# Patient Record
Sex: Male | Born: 1965
Health system: Southern US, Community
[De-identification: ages and names within clinical notes are randomized; demographics above are authoritative.]

## PROBLEM LIST (undated history)

## (undated) DIAGNOSIS — E785 Hyperlipidemia, unspecified: Secondary | ICD-10-CM

## (undated) DIAGNOSIS — E079 Disorder of thyroid, unspecified: Secondary | ICD-10-CM

## (undated) HISTORY — PX: APPENDECTOMY: SHX54

## (undated) HISTORY — DX: Hyperlipidemia, unspecified: E78.5

## (undated) HISTORY — DX: Disorder of thyroid, unspecified: E07.9

---

## 2008-12-20 ENCOUNTER — Ambulatory Visit: Payer: Self-pay | Admitting: Family Medicine

## 2008-12-20 DIAGNOSIS — E039 Hypothyroidism, unspecified: Secondary | ICD-10-CM

## 2008-12-20 DIAGNOSIS — E785 Hyperlipidemia, unspecified: Secondary | ICD-10-CM | POA: Insufficient documentation

## 2009-02-14 ENCOUNTER — Ambulatory Visit: Payer: Self-pay | Admitting: Family Medicine

## 2009-02-15 LAB — CONVERTED CEMR LAB: TSH: 0.181 microintl units/mL — ABNORMAL LOW (ref 0.350–4.500)

## 2009-05-03 ENCOUNTER — Ambulatory Visit: Payer: Self-pay | Admitting: Family Medicine

## 2009-05-03 LAB — CONVERTED CEMR LAB: Inflenza A Ag: NEGATIVE

## 2009-05-04 LAB — CONVERTED CEMR LAB
LDL Cholesterol: 117 mg/dL — ABNORMAL HIGH (ref 0–99)
TSH: 0.505 microintl units/mL (ref 0.350–4.500)
Triglycerides: 160 mg/dL — ABNORMAL HIGH (ref <150)

## 2009-11-21 ENCOUNTER — Ambulatory Visit: Payer: Self-pay | Admitting: Family Medicine

## 2009-11-21 DIAGNOSIS — M25519 Pain in unspecified shoulder: Secondary | ICD-10-CM

## 2009-11-21 DIAGNOSIS — R5383 Other fatigue: Secondary | ICD-10-CM

## 2009-11-21 DIAGNOSIS — R5381 Other malaise: Secondary | ICD-10-CM

## 2009-11-23 LAB — CONVERTED CEMR LAB
ALT: 26 units/L (ref 0–53)
Alkaline Phosphatase: 83 units/L (ref 39–117)
LDL Cholesterol: 201 mg/dL — ABNORMAL HIGH (ref 0–99)
MCHC: 34.3 g/dL (ref 30.0–36.0)
RDW: 13.4 % (ref 11.5–15.5)
Sed Rate: 2 mm/hr (ref 0–16)
Sex Hormone Binding: 30 nmol/L (ref 13–71)
Sodium: 138 meq/L (ref 135–145)
Testosterone Free: 107.6 pg/mL (ref 47.0–244.0)
Total Bilirubin: 1.1 mg/dL (ref 0.3–1.2)
Total Protein: 7 g/dL (ref 6.0–8.3)
Triglycerides: 151 mg/dL — ABNORMAL HIGH (ref <150)
VLDL: 30 mg/dL (ref 0–40)

## 2009-11-29 ENCOUNTER — Telehealth: Payer: Self-pay | Admitting: Family Medicine

## 2010-01-16 ENCOUNTER — Ambulatory Visit: Payer: Self-pay | Admitting: Emergency Medicine

## 2010-01-16 LAB — CONVERTED CEMR LAB: Rapid Strep: NEGATIVE

## 2010-05-14 NOTE — Assessment & Plan Note (Signed)
Summary: SORE THROAT/NH   Vital Signs:  Patient Profile:   45 Years Old Male CC:      sore throat, sinus pressure  Height:     75.1 inches Weight:      225 pounds O2 Sat:      98 % O2 treatment:    Room Air Temp:     99.0 degrees F oral Pulse rate:   73 / minute Resp:     14 per minute BP sitting:   133 / 77  (left arm) Cuff size:   large  Vitals Entered By: Lajean Saver RN (January 16, 2010 9:24 AM)                  Updated Prior Medication List: LEVOTHROID 150 MCG TABS (LEVOTHYROXINE SODIUM) Take 1 tablet by mouth once a day CRESTOR 10 MG TABS (ROSUVASTATIN CALCIUM) Take 1 tablet by mouth once a day  Current Allergies: No known allergies History of Present Illness Chief Complaint: sore throat, sinus pressure  History of Present Illness: Patient complains of onset of cold symptoms for 2 days.  They have been usingno OTC meds.  He says he tends to get sinus infections quickly.  Last one more than a year ago. + sore throat + cough No pleuritic pain No wheezing + nasal congestion + post-nasal drainage + sinus pain/pressure No itchy/red eyes No earache No hemoptysis No SOB No chills/sweats No fever No nausea No vomiting No abdominal pain No diarrhea No skin rashes No fatigue No myalgias No headache   REVIEW OF SYSTEMS Constitutional Symptoms      Denies fever, chills, night sweats, weight loss, weight gain, and fatigue.  Eyes       Denies change in vision, eye pain, eye discharge, glasses, contact lenses, and eye surgery. Ear/Nose/Throat/Mouth       Complains of sinus problems and sore throat.      Denies hearing loss/aids, change in hearing, ear pain, ear discharge, dizziness, frequent runny nose, frequent nose bleeds, hoarseness, and tooth pain or bleeding.  Respiratory       Denies dry cough, productive cough, wheezing, shortness of breath, asthma, bronchitis, and emphysema/COPD.  Cardiovascular       Denies murmurs, chest pain, and tires easily with  exhertion.    Gastrointestinal       Denies stomach pain, nausea/vomiting, diarrhea, constipation, blood in bowel movements, and indigestion. Genitourniary       Denies painful urination, kidney stones, and loss of urinary control. Neurological       Denies paralysis, seizures, and fainting/blackouts. Musculoskeletal       Denies muscle pain, joint pain, joint stiffness, decreased range of motion, redness, swelling, muscle weakness, and gout.  Skin       Denies bruising, unusual mles/lumps or sores, and hair/skin or nail changes.  Psych       Denies mood changes, temper/anger issues, anxiety/stress, speech problems, depression, and sleep problems.  Past History:  Past Medical History: Reviewed history from 12/20/2008 and no changes required. Hyperlipidemia Hypothyroidism  Past Surgical History: Reviewed history from 02/14/2009 and no changes required. Appendectomy 1988  Family History: Reviewed history from 12/20/2008 and no changes required. noncontributory  Social History: BS degree.  Single. Has 2 kids.  Alcohol use-no Drug use-no Regular exercise-no Caffeine 2-3 drinks per day.  Physical Exam General appearance: well developed, well nourished, no acute distress Head: sinus tenderness Ears: normal, no lesions or deformities Nasal: mucosa pink, nonedematous, no septal deviation, turbinates normal  Oral/Pharynx: tongue normal, posterior pharynx without erythema or exudate Chest/Lungs: no rales, wheezes, or rhonchi bilateral, breath sounds equal without effort Heart: regular rate and  rhythm, no murmur MSE: oriented to time, place, and person Assessment New Problems: SINUSITIS, ACUTE (ICD-461.9)  Rapid strep negative  Patient Education: Patient and/or caregiver instructed in the following: rest, fluids, Tylenol prn. nasal saline, Sudafed, Mucinex, OTC cough meds  Plan New Medications/Changes: AMOXICILLIN 875 MG TABS (AMOXICILLIN) 1 tab by mouth two times a day  for 10 days  #20 x 0, 01/16/2010, Hoyt Koch MD  New Orders: Est. Patient Level II [[address]109] Rapid Strep [60454] Planning Comments:   Follow-up with your primary care physician if not improving or if getting worse  It may take 7-10 days to get better   The patient and/or caregiver has been counseled thoroughly with regard to medications prescribed including dosage, schedule, interactions, rationale for use, and possible side effects and they verbalize understanding.  Diagnoses and expected course of recovery discussed and will return if not improved as expected or if the condition worsens. Patient and/or caregiver verbalized understanding.  Prescriptions: AMOXICILLIN 875 MG TABS (AMOXICILLIN) 1 tab by mouth two times a day for 10 days  #20 x 0   Entered and Authorized by:   Hoyt Koch MD   Signed by:   Hoyt Koch MD on 01/16/2010   Method used:   Print then Give to Patient   RxID:   [id number]   Orders Added: 1)  Est. Patient Level II [30865] 2)  Rapid Strep [78469]  Laboratory Results  Date/Time Received: January 16, 2010 9:26 AM  Date/Time Reported: January 16, 2010 9:26 AM   Other Tests  Rapid Strep: negative  Kit Test Internal QC: Negative   (Normal Range: Negative)

## 2010-05-14 NOTE — Assessment & Plan Note (Signed)
Summary: CPE, fatigue   Vital Signs:  Patient profile:   45 year old male Height:      75.1 inches Weight:      229 pounds BMI:     28.65 Pulse rate:   81 / minute BP sitting:   126 / 82  (left arm) Cuff size:   large  Vitals Entered By: Avon Gully CMA, Duncan Dull) (November 21, 2009 8:12 AM) CC: CPE, wt gain and fatigue, concerned thyroid meds may need to be adjusted   Primary Care Provider:  Nani Gasser MD  CC:  CPE, wt gain and fatigue, and concerned thyroid meds may need to be adjusted.  History of Present Illness: CPE, wt gain and fatigue, concerned thyroid meds may need to be adjusted. Has gained 14 lbs. Has been off the Crestor for months.  BP looks great.  Has no motivation as well. Has noticed some shoulder joint pain.  Plays softball. Feels hips are sore.  Not sleeping well.  Feeling more tired in the mornings. No snoring. Increased stress right now.  having bilat shoulder pain. Worse when he sleeps on his shoulders. Says he has not been very active or exercising and feels it may from that.  Uncomfortable to fully extend arms above his head.  Not taking any pain meds for it.  No old injuries.   Current Medications (verified): 1)  Levothroid 150 Mcg Tabs (Levothyroxine Sodium) .... Take 1 Tablet By Mouth Once A Day  Allergies (verified): No Known Drug Allergies  Comments:  Nurse/Medical Assistant: The patient's medications and allergies were reviewed with the patient and were updated in the Medication and Allergy Lists. Avon Gully CMA, Duncan Dull) (November 21, 2009 8:14 AM)  Physical Exam  General:  Well-developed,well-nourished,in no acute distress; alert,appropriate and cooperative throughout examination Head:  Normocephalic and atraumatic without obvious abnormalities. No apparent alopecia or balding. Eyes:  No corneal or conjunctival inflammation noted. EOMI. Perrla.  Ears:  External ear exam shows no significant lesions or deformities.  Otoscopic  examination reveals clear canals, tympanic membranes are intact bilaterally without bulging, retraction, inflammation or discharge. Hearing is grossly normal bilaterally. Nose:  External nasal examination shows no deformity or inflammation. Nasal mucosa are pink and moist without lesions or exudates. Mouth:  Oral mucosa and oropharynx without lesions or exudates.  Teeth in good repair. Neck:  No deformities, masses, or tenderness noted. Chest Wall:  No deformities, masses, tenderness or gynecomastia noted. Lungs:  Normal respiratory effort, chest expands symmetrically. Lungs are clear to auscultation, no crackles or wheezes. Heart:  Normal rate and regular rhythm. S1 and S2 normal without gallop, murmur, click, rub or other extra sounds. Abdomen:  Bowel sounds positive,abdomen soft and non-tender without masses, organomegaly or hernias noted. Msk:  No deformity or scoliosis noted of thoracic or lumbar spine.  Shoulder wtih extension to about 160 degrees.  Nontender on exam. Shoulder strength 5/5.  Pulses:  R and L carotid,radial,dorsalis pedis and posterior tibial pulses are full and equal bilaterally Extremities:  No clubbing, cyanosis, edema, or deformity noted with normal full range of motion of all joints.   Neurologic:  No cranial nerve deficits noted. Station and gait are normal.  DTRs are symmetrical throughout. Sensory, motor and coordinative functions appear intact. Skin:  TAg moleon his right neck. a few erythematous papules on his shoulder. He says they ahve been present since he had a bad sunburn 2 months ago.  Cervical Nodes:  No lymphadenopathy noted Psych:  Cognition and judgment appear intact.  Alert and cooperative with normal attention span and concentration. No apparent delusions, illusions, hallucinations   Impression & Recommendations:  Problem # 1:  HEALTH MAINTENANCE EXAM (ICD-V70.0) If lesion on shoulders don't resolve in the next month or two recommend bx.  Let himknow we  can remove the mole on his neck for cosmetic reasons.  Due for screening labs. Will check TSH since has been complaining of fatigue and joint aches.  Orders: T-Comprehensive Metabolic Panel [id number]) T-Lipid Profile ([id number]) T-TSH [id number])  Problem # 2:  FATIGUE (ICD-780.79) sCreen for anmia, thyroid d/o etc. Will check sed rate since shoudlers and hips have been painful as well.  Also discussed that he likely has depression. Dsicussed potential tx but he really feels like his lack of sleep is causing his mood problem and is not interested in tx. Expalined how these 2 can be related. His did screen Yes to every question on the low "t" questionnair so will check his hormone levels.  His PHQ-9 score was 12 (moderate). Orders: T-CBC No Diff ([id number]) T-Testosterone, Free and Total [id number]) T-Sed Rate (Automated) [id number])  Problem # 3:  SHOULDER PAIN (ICD-719.41) Likely bursits based on exam but pt wasn't really interested in any intervention or tx a this time.   Complete Medication List: 1)  Levothroid 150 Mcg Tabs (Levothyroxine sodium) .... Take 1 tablet by mouth once a day  Patient Instructions: 1)  We will call you with your lab results on Friday.   TD Result Date:  04/14/2006 TD Result:  given TD Next Due:  10 yr

## 2010-05-14 NOTE — Progress Notes (Signed)
Summary: meds  Phone Note Call from Patient   Caller: Patient Call For: Nani Gasser MD Summary of Call: pt called back and wants to know if he can get 10 mg of crestor and cut them in half so he can get more of the medicine and not have to switch to Lipitor Initial call taken by: Avon Gully CMA, Duncan Dull),  November 29, 2009 1:42 PM  Follow-up for Phone Call        Absolutely. Will call in teh 10mg  dose.  Follow-up by: Nani Gasser MD,  November 29, 2009 2:34 PM  Additional Follow-up for Phone Call Additional follow up Details #1::        pt notified Additional Follow-up by: Avon Gully CMA, Duncan Dull),  November 29, 2009 2:46 PM    New/Updated Medications: CRESTOR 10 MG TABS (ROSUVASTATIN CALCIUM) Take 1 tablet by mouth once a day Prescriptions: CRESTOR 10 MG TABS (ROSUVASTATIN CALCIUM) Take 1 tablet by mouth once a day  #30 x 2   Entered and Authorized by:   Nani Gasser MD   Signed by:   Nani Gasser MD on 11/29/2009   Method used:   Electronically to        [facility] # [phone]* (retail)       [address] [place], [place]  [address]       Ph: [phone]       Fax: [phone]   RxID:   [id number]

## 2010-05-14 NOTE — Assessment & Plan Note (Signed)
Summary: URI, labs   Vital Signs:  Patient profile:   45 year old male Height:      75.1 inches Weight:      215 pounds O2 Sat:      98 % on Room air Temp:     98.8 degrees F oral Pulse rate:   87 / minute BP sitting:   107 / 68  (left arm) Cuff size:   large  Vitals Entered By: Kathlene November (May 03, 2009 10:52 AM)  O2 Flow:  Room air CC: for 2 days running fever at night- did not take temp- sweats, head congestion, H/A, nasal drainage, joints ache. Did take #3 Cipro in the last 2 days   Primary Care Provider:  Nani Gasser MD  CC:  for 2 days running fever at night- did not take temp- sweats, head congestion, H/A, nasal drainage, and joints ache. Did take #3 Cipro in the last 2 days.  History of Present Illness: for 2 days running fever at night- did not take temp- sweats, head congestion, H/A, nasal drainage, joints ache. Did take #3 Cipro in the last 2 days.  It is an old med had left over.  Feels alittle SOB.  No hx of asthma.  Still smokes. Not on any  OTC meds. does feel a little better today.   Current Medications (verified): 1)  Crestor 5 Mg  Tabs (Rosuvastatin Calcium) .... Take 1 Tab By Mouth Daily 2)  Levothroid 150 Mcg Tabs (Levothyroxine Sodium) .... Take 1 Tablet By Mouth Once A Day  Allergies (verified): No Known Drug Allergies  Comments:  Nurse/Medical Assistant: The patient's medications and allergies were reviewed with the patient and were updated in the Medication and Allergy Lists. Kathlene November (May 03, 2009 10:54 AM)  Social History: Reviewed history from 02/14/2009 and no changes required. BS degree.  Single. Has 2 kids.  Current Smoker Alcohol use-yes Alcohol use-no Drug use-no Regular exercise-no Caffeine 2-3 drinks per day.   Physical Exam  General:  Well-developed,well-nourished,in no acute distress; alert,appropriate and cooperative throughout examination Head:  Normocephalic and atraumatic without obvious abnormalities. No  apparent alopecia or balding. Eyes:  No corneal or conjunctival inflammation noted. EOMI. Perrla.  Ears:  External ear exam shows no significant lesions or deformities.  Otoscopic examination reveals clear canals, tympanic membranes are intact bilaterally without bulging, retraction, inflammation or discharge. Hearing is grossly normal bilaterally. Nose:  External nasal examination shows no deformity or inflammation. Nasal mucosa are pink and moist without lesions or exudates. Mouth:  Oral mucosa and oropharynx without lesions or exudates.  Teeth in good repair. Neck:  No deformities, masses, or tenderness noted. Lungs:  Normal respiratory effort, chest expands symmetrically. Lungs are clear to auscultation, no crackles or wheezes. Heart:  Normal rate and regular rhythm. S1 and S2 normal without gallop, murmur, click, rub or other extra sounds. Pulses:  Radial 2+  Skin:  no rashes.   Cervical Nodes:  No lymphadenopathy noted Psych:  Cognition and judgment appear intact. Alert and cooperative with normal attention span and concentration. No apparent delusions, illusions, hallucinations   Impression & Recommendations:  Problem # 1:  URI (ICD-465.9) Flu like illness thought flu test was neg.  Dsicussed symptomatic care. Call if not better into next week or if has fever for more than 4-5 days.    Orders: Flu A+B (69678)  Problem # 2:  HYPOTHYROIDISM (ICD-244.9) Overdue to check level.  His updated medication list for this problem includes:  Levothroid 150 Mcg Tabs (Levothyroxine sodium) .Marland Kitchen... Take 1 tablet by mouth once a day  Orders: T-TSH ([id number])  Problem # 3:  HYPERLIPIDEMIA (ICD-272.4) Overdue to check level.  His updated medication list for this problem includes:    Crestor 5 Mg Tabs (Rosuvastatin calcium) .Marland Kitchen... Take 1 tab by mouth daily  Orders: T-Lipid Profile ([id number])  Complete Medication List: 1)  Crestor 5 Mg Tabs (Rosuvastatin calcium) .... Take 1 tab by  mouth daily 2)  Levothroid 150 Mcg Tabs (Levothyroxine sodium) .... Take 1 tablet by mouth once a day  Laboratory Results  Date/Time Received: 05/03/2009 Date/Time Reported: 05/03/2009  Other Tests  Influenza A: negative Influenza B: negative

## 2010-05-14 NOTE — Progress Notes (Signed)
Summary: meds  Phone Note Call from Patient   Caller: Patient Call For: Nani Gasser MD Summary of Call: Insurance does not cover the crestor.what eles can be rx'ed Initial call taken by: Avon Gully CMA, Duncan Dull),  November 29, 2009 10:36 AM  Follow-up for Phone Call        Can try lipitor instead. Rx sent.  Follow-up by: Nani Gasser MD,  November 29, 2009 11:32 AM  Additional Follow-up for Phone Call Additional follow up Details #1::        pt notified Additional Follow-up by: Avon Gully CMA, Duncan Dull),  November 29, 2009 11:37 AM    New/Updated Medications: LIPITOR 40 MG TABS (ATORVASTATIN CALCIUM) Take 1 tablet by mouth once a day at bedtime Prescriptions: LIPITOR 40 MG TABS (ATORVASTATIN CALCIUM) Take 1 tablet by mouth once a day at bedtime  #30 x 2   Entered and Authorized by:   Nani Gasser MD   Signed by:   Nani Gasser MD on 11/29/2009   Method used:   Electronically to        [facility] # [phone]* (retail)       [address] [place], [place]  [address]       Ph: [phone]       Fax: [phone]   RxID:   [id number]

## 2010-06-11 ENCOUNTER — Telehealth: Payer: Self-pay | Admitting: Family Medicine

## 2010-06-13 ENCOUNTER — Encounter: Payer: Self-pay | Admitting: Family Medicine

## 2010-06-13 ENCOUNTER — Ambulatory Visit (INDEPENDENT_AMBULATORY_CARE_PROVIDER_SITE_OTHER): Payer: BC Managed Care – PPO | Admitting: Family Medicine

## 2010-06-13 DIAGNOSIS — D239 Other benign neoplasm of skin, unspecified: Secondary | ICD-10-CM

## 2010-06-13 DIAGNOSIS — Z7251 High risk heterosexual behavior: Secondary | ICD-10-CM

## 2010-06-14 ENCOUNTER — Encounter: Payer: Self-pay | Admitting: Family Medicine

## 2010-06-14 LAB — CONVERTED CEMR LAB
Chlamydia, Swab/Urine, PCR: NEGATIVE
GC Probe Amp, Urine: NEGATIVE
HIV: NONREACTIVE

## 2010-06-20 NOTE — Assessment & Plan Note (Signed)
Summary: Mole removal, STD check   Vital Signs:  Patient profile:   45 year old male Height:      75.1 inches Weight:      216 pounds Pulse rate:   85 / minute BP sitting:   123 / 76  (right arm) Cuff size:   large  Vitals Entered By: Avon Gully CMA, Duncan Dull) (June 13, 2010 10:24 AM) CC: skin tag removed on neck,std testing   Primary Care Provider:  Nani Gasser MD  CC:  skin tag removed on neck and std testing.  History of Present Illness: Here for skin tag removal  Has new girlfriend and wouldlike to be checked for STDs. Says has had not d/c or pain but wouldl ike to be checked.   Current Medications (verified): 1)  Levothroid 150 Mcg Tabs (Levothyroxine Sodium) .... Take 1 Tablet By Mouth Once A Day 2)  Crestor 10 Mg Tabs (Rosuvastatin Calcium) .... Take 1 Tablet By Mouth Once A Day  Allergies (verified): No Known Drug Allergies  Comments:  Nurse/Medical Assistant: The patient's medications and allergies were reviewed with the patient and were updated in the Medication and Allergy Lists. Avon Gully CMA, Duncan Dull) (June 13, 2010 10:25 AM)   Impression & Recommendations:  Problem # 1:  BENIGN NEOPLASM OF OTHER SPECIFIED SITES OF SKIN (ICD-216.8)  Mole was removed. We didn't send for pathology. Call if any concernes with wound healing.   Orders: Shave Skin Lesion < 0.5cm face/ears/eyelids/nose/lips/mm (11310)  Problem # 2:  SEXUAL ACTIVITY, HIGH RISK (ICD-V69.2) Will check for STD. eXplained that antibodies to herpes doesn't mean he has active dz.   Complete Medication List: 1)  Levothroid 150 Mcg Tabs (Levothyroxine sodium) .... Take 1 tablet by mouth once a day 2)  Crestor 10 Mg Tabs (Rosuvastatin calcium) .... Take 1 tablet by mouth once a day  Other Orders: T-HIV Antibody  (Reflex) [id number]) T-Syphilis Test (RPR) [id number]) T-Chlamydia & GC Probe, Urine ([id number]) T-Herpes Simplex Type 1 (96295-28413) T-Herpes Simplex Type  2 (24401-02725)   Orders Added: 1)  T-HIV Antibody  (Reflex) [[id number]] 2)  T-Syphilis Test (RPR) [25956-38756] 3)  T-Chlamydia & GC Probe, Urine [[id number]] 4)  T-Herpes Simplex Type 1 [43329-51884] 5)  T-Herpes Simplex Type 2 [86696-81071] 6)  Shave Skin Lesion < 0.5cm face/ears/eyelids/nose/lips/mm [11310] 7)  Est. Patient Level II [16606]    Procedure Note Last Tetanus: given (04/14/2006)  Mole Biopsy/Removal: Indication: inflamed lesion  Procedure # 1: shave biopsy    Size (in cm): 0.5 x 0.5    Region: Left side of neckl     Instrument used: double edge blade.     Anesthesia: 1% lidocaine w/epinephrine  Cleaned and prepped with: alcohol and betadine Wound dressing: bulky gauze dressing Additional Instructions: Heostasis acheived wtih pressure.

## 2010-06-20 NOTE — Progress Notes (Signed)
Summary: Testing  Phone Note Call from Patient Call back at [phone]   Caller: Patient Summary of Call: Pt has an appt on 06/13/10 to remove a mole on his neck, he has also requested STD testing, pls contact pt Initial call taken by: Lannette Donath,  June 11, 2010 4:30 PM  Follow-up for Phone Call        ? appt made;called pt and he didnt have any questions Follow-up by: Avon Gully CMA, Duncan Dull),  June 12, 2010 1:49 PM

## 2010-07-17 ENCOUNTER — Other Ambulatory Visit: Payer: Self-pay | Admitting: Family Medicine

## 2010-08-13 ENCOUNTER — Ambulatory Visit: Payer: BC Managed Care – PPO | Admitting: Family Medicine

## 2010-08-15 ENCOUNTER — Ambulatory Visit: Payer: BC Managed Care – PPO | Admitting: Family Medicine

## 2010-09-27 ENCOUNTER — Other Ambulatory Visit: Payer: Self-pay | Admitting: Family Medicine

## 2010-10-02 ENCOUNTER — Encounter: Payer: Self-pay | Admitting: Family Medicine

## 2010-10-03 ENCOUNTER — Encounter: Payer: Self-pay | Admitting: Family Medicine

## 2010-10-03 ENCOUNTER — Ambulatory Visit (INDEPENDENT_AMBULATORY_CARE_PROVIDER_SITE_OTHER): Payer: BC Managed Care – PPO | Admitting: Family Medicine

## 2010-10-03 DIAGNOSIS — J329 Chronic sinusitis, unspecified: Secondary | ICD-10-CM

## 2010-10-03 DIAGNOSIS — M25521 Pain in right elbow: Secondary | ICD-10-CM

## 2010-10-03 DIAGNOSIS — E039 Hypothyroidism, unspecified: Secondary | ICD-10-CM

## 2010-10-03 DIAGNOSIS — E785 Hyperlipidemia, unspecified: Secondary | ICD-10-CM

## 2010-10-03 DIAGNOSIS — B009 Herpesviral infection, unspecified: Secondary | ICD-10-CM

## 2010-10-03 DIAGNOSIS — M25529 Pain in unspecified elbow: Secondary | ICD-10-CM

## 2010-10-03 MED ORDER — AMOXICILLIN 875 MG PO TABS: 875.0000 mg | ORAL_TABLET | Freq: Two times a day (BID) | ORAL | Status: AC

## 2010-10-03 NOTE — Assessment & Plan Note (Signed)
He is to recheck his lipids as his LDL was not quite at goal in January. I also recommended regular exercise and weight loss. He's gained over 20 pounds in the last 2 years.

## 2010-10-03 NOTE — Progress Notes (Signed)
  Subjective:    Patient ID: Michael Wong, male    DOB: 1965-08-31, 45 y.o.   MRN: [id number]  HPI  He is here today with his girlfriend to discuss his lab tests his sugar he is positive for herpes simplex 1 herpes simplex 2.  He also has had nasal congestion for last couple weeks it got worse the last 3 days. No fever no cough or short of breath. Mild sore throat the last couple days. No ear pain. He has not been taking any over-the-counter decongestants. He says he feels that his nose is full of solid. If that'll mild facial pain and pressure. No recent allergy symptoms.  He also thinks he is due for his cholesterol and thyroid levels.  He also has a right elbow injury. He says he tore a tendon and would like to see an orthopedist about it. He says it's just not healing on its own.  Review of Systems     Objective:   Physical Exam  Constitutional: He is oriented to person, place, and time. He appears well-developed and well-nourished.  HENT:  Head: Normocephalic and atraumatic.  Right Ear: External ear normal.  Left Ear: External ear normal.  Nose: Nose normal.  Mouth/Throat: Oropharynx is clear and moist.       TMs and canals are clear. He has a large hole in his nasal septum.  Eyes: Conjunctivae and EOM are normal. Pupils are equal, round, and reactive to light.  Neck: Neck supple. No thyromegaly present.  Cardiovascular: Normal rate and normal heart sounds.   Pulmonary/Chest: Effort normal and breath sounds normal.  Lymphadenopathy:    He has no cervical adenopathy.  Neurological: He is alert and oriented to person, place, and time.  Skin: Skin is warm and dry.  Psychiatric: He has a normal mood and affect.          Assessment & Plan:  Sinusitis-we will go ahead and treat with antibiotics. If not better in 10 days please let me know. He can use an over-the-counter symptomatic treatment such as cold medications if he would like.  Positive test for herpes simplex 1  and 2-we discussed that does result me that he is status post and has antibodies. If he does not have any active lesions and he may not actually shed the virus. Though there still are small risk of still shedding the virus even without lesions. I did recommend using condoms. And certainly if he notices a mouth or penile lesion thinking come in and we can swallow but to confirm if he does have active herpes simplex. We also discussed the risk of transformation to an infant during the delivery process.

## 2010-10-03 NOTE — Assessment & Plan Note (Signed)
He is due to recheck his levels.

## 2010-10-04 ENCOUNTER — Telehealth: Payer: Self-pay | Admitting: Family Medicine

## 2010-10-04 LAB — TSH: TSH: 3.156 u[IU]/mL (ref 0.350–4.500)

## 2010-10-04 LAB — COMPLETE METABOLIC PANEL WITH GFR
ALT: 42 U/L (ref 0–53)
CO2: 27 mEq/L (ref 19–32)
Calcium: 10.2 mg/dL (ref 8.4–10.5)
Chloride: 100 mEq/L (ref 96–112)
Creat: 1.1 mg/dL (ref 0.50–1.35)
GFR, Est African American: >60 mL/min (ref >60)

## 2010-10-04 LAB — LIPID PANEL
HDL: 44 mg/dL (ref >39)
LDL Cholesterol: 119 mg/dL — ABNORMAL HIGH (ref 0–99)
Total CHOL/HDL Ratio: 4.2 Ratio

## 2010-10-04 MED ORDER — ROSUVASTATIN CALCIUM 10 MG PO TABS: 20.0000 mg | ORAL_TABLET | Freq: Every day | ORAL | Status: DC

## 2010-10-04 NOTE — Telephone Encounter (Signed)
Consultation: Complete metabolic panel and thyroid look great. Cholesterol looks much much better. LDL is down to 119. We are MS there. I would like to increase his Crestor to 20 mg if he is okay with this. Then recheck cholesterol in 6 months.

## 2010-10-04 NOTE — Telephone Encounter (Signed)
Pt notified by [facility] to increase the crestor to 20 mg.  Repeat Fast chol panel in 6 mths.  Gave details of lab results. Jarvis Newcomer, LPN [facility]

## 2010-10-10 ENCOUNTER — Other Ambulatory Visit: Payer: Self-pay | Admitting: Sports Medicine

## 2010-10-10 ENCOUNTER — Ambulatory Visit
Admission: RE | Admit: 2010-10-10 | Discharge: 2010-10-10 | Disposition: A | Discharge status: Home / Self Care | Payer: BC Managed Care – PPO | Source: Ambulatory Visit | Attending: Sports Medicine | Admitting: Sports Medicine

## 2010-10-10 DIAGNOSIS — M25521 Pain in right elbow: Secondary | ICD-10-CM

## 2010-10-25 ENCOUNTER — Telehealth: Payer: Self-pay | Admitting: Family Medicine

## 2010-10-25 NOTE — Telephone Encounter (Signed)
Pt called and said he thinks the pharm has messed up his Crestor script. Plan:  Reviewed the pt chart and explained to the pt he was on 10mg  prior to provider increasing the dose in June of this year and he was to take 2 of his 10 mg to make the 20 mg dose every night.  Also, explained to the pt that his new dose is 20 mg daily and a new script was sent to his pharm for that dose for #30/6 refills and even called the pharm to verify they had that script at the pharm and they did. Jarvis Newcomer, LPN [facility]

## 2011-05-22 ENCOUNTER — Other Ambulatory Visit: Payer: Self-pay | Admitting: Family Medicine

## 2011-07-24 ENCOUNTER — Telehealth: Payer: Self-pay | Admitting: *Deleted

## 2011-07-24 MED ORDER — ROSUVASTATIN CALCIUM 10 MG PO TABS: 10.0000 mg | ORAL_TABLET | Freq: Every day | ORAL | Status: DC

## 2011-07-24 NOTE — Telephone Encounter (Signed)
Pt states that he thought he was only suppose to take 5mg  Crestor but he recently went to have it refilled since he has insurance now and that his bottled stated take 2 10mg  tabs nightly. Pt would like to know if this is correct and if you want him to have some labs done since the last ones were June 2012. He also states that if he needs to make an appt he will. Please advise.

## 2011-07-24 NOTE — Telephone Encounter (Signed)
I had in the systme he was taking 10 mg so when I got the cholesterol level back in August it was still not completely at goal so I increased it to 2 tabs. If he was only taking 5 mg and I would like him to increase to 10 once a day. Then he has he needs to followup in about one month on the 10 mg tablet.

## 2011-07-24 NOTE — Telephone Encounter (Signed)
Pt informed

## 2011-08-08 ENCOUNTER — Other Ambulatory Visit: Payer: Self-pay | Admitting: Family Medicine

## 2011-09-01 ENCOUNTER — Other Ambulatory Visit: Payer: Self-pay | Admitting: Family Medicine

## 2011-10-05 ENCOUNTER — Other Ambulatory Visit: Payer: Self-pay | Admitting: Family Medicine

## 2011-10-20 ENCOUNTER — Encounter: Payer: Self-pay | Admitting: Physician Assistant

## 2011-10-20 ENCOUNTER — Ambulatory Visit (INDEPENDENT_AMBULATORY_CARE_PROVIDER_SITE_OTHER): Payer: BC Managed Care – PPO | Admitting: Physician Assistant

## 2011-10-20 VITALS — BP 120/83 | HR 85 | Temp 98.8°F | Ht 72.0 in | Wt 227.0 lb

## 2011-10-20 DIAGNOSIS — J029 Acute pharyngitis, unspecified: Secondary | ICD-10-CM

## 2011-10-20 DIAGNOSIS — Z131 Encounter for screening for diabetes mellitus: Secondary | ICD-10-CM

## 2011-10-20 DIAGNOSIS — E039 Hypothyroidism, unspecified: Secondary | ICD-10-CM

## 2011-10-20 DIAGNOSIS — J329 Chronic sinusitis, unspecified: Secondary | ICD-10-CM

## 2011-10-20 DIAGNOSIS — E785 Hyperlipidemia, unspecified: Secondary | ICD-10-CM

## 2011-10-20 LAB — LIPID PANEL
HDL: 43 mg/dL (ref >39)
LDL Cholesterol: 109 mg/dL — ABNORMAL HIGH (ref 0–99)
Total CHOL/HDL Ratio: 4.2 Ratio
VLDL: 30 mg/dL (ref 0–40)

## 2011-10-20 LAB — COMPREHENSIVE METABOLIC PANEL
ALT: 43 U/L (ref 0–53)
Alkaline Phosphatase: 70 U/L (ref 39–117)
Sodium: 140 mEq/L (ref 135–145)
Total Bilirubin: 1.2 mg/dL (ref 0.3–1.2)
Total Protein: 7.2 g/dL (ref 6.0–8.3)

## 2011-10-20 MED ORDER — AMOXICILLIN-POT CLAVULANATE 875-125 MG PO TABS: 1.0000 | ORAL_TABLET | Freq: Two times a day (BID) | ORAL | Status: AC

## 2011-10-20 NOTE — Progress Notes (Signed)
  Subjective:    Patient ID: Michael Wong, male    DOB: [birth year], 46 y.o.   MRN: [id number]  HPI Patient presents to the clinic because he's had sinus pressure and headaches off and on for the last month. If sinus pressure and headaches have progressively been getting worse. He feels like he had a fever a couple of times in the last month but has not checked it. He reports ear pressure, sore throat, dry cough. He denies any chills, shortness of breath, chest pains, palpitations. He has gargled with salt water multiple times and used ibuprofen. This treatment helps minimally.   Patient has hypothyroidism and needs blood work and refills on meds today.  Patient also has hyperlipidemia and is due for a cholesterol check and refill on medications. He has not been exercising or watching his diet.  Review of Systems     Objective:   Physical Exam  Constitutional: He is oriented to person, place, and time. He appears well-developed and well-nourished.  HENT:  Head: Normocephalic and atraumatic.  Right Ear: External ear normal.  Left Ear: External ear normal.  Nose: Nose normal.       TMs are normal bilaterally. Positive for maxillary tenderness to palpation over the frontal sinuses but negative to tenderness over the maxillary sinuses. Oropharynx was red but not swollen and negative for exudate.  Eyes: Conjunctivae are normal.  Neck: Normal range of motion. Neck supple. No thyromegaly present.       Bilateral anterior cervical superficial enlargement without tenderness to palpation.  Cardiovascular: Normal rate, regular rhythm and normal heart sounds.   Pulmonary/Chest: Effort normal and breath sounds normal. He has no wheezes.  Lymphadenopathy:    He has cervical adenopathy.  Neurological: He is alert and oriented to person, place, and time.  Skin: Skin is warm and dry.  Psychiatric: He has a normal mood and affect. His behavior is normal.          Assessment & Plan:    Sinusitis-patient was given Augmentin to take twice a day for 10 days. Patient was encouraged to use Mucinex twice a day to help him clog mucus in sinuses. Handout was given on sinusitis. If patient continues to worsen please give office a call.  Sore throat- Rapid Strep was negative. Continue with symptomatic care such as gargling with salt water, throat lozengers, and honey. Sore throat could be viral or do to postnasal drip draining from the sinuses.  Hypothyroidism-labs were drawn today and will call patient with results and any dose changes.  Hyperlipidemia-labs were drawn today and patient will be called with any changes to medication. As always he was encouraged to exercise more regularly and avoid fried foods, high that she.

## 2011-10-20 NOTE — Patient Instructions (Addendum)
Augmentin given to take for 10 days. Consider Mucinex twice a days to get rid of mucus.   Sinusitis Sinuses are air pockets within the bones of your face. The growth of bacteria within a sinus leads to infection. The infection prevents the sinuses from draining. This infection is called sinusitis. SYMPTOMS  There will be different areas of pain depending on which sinuses have become infected.  The maxillary sinuses often produce pain beneath the eyes.   Frontal sinusitis may cause pain in the middle of the forehead and above the eyes.  Other problems (symptoms) include:  Toothaches.   Colored, pus-like (purulent) drainage from the nose.   Swelling, warmth, and tenderness over the sinus areas may be signs of infection.  TREATMENT  Sinusitis is most often determined by an exam.X-rays may be taken. If x-rays have been taken, make sure you obtain your results or find out how you are to obtain them. Your caregiver may give you medications (antibiotics). These are medications that will help kill the bacteria causing the infection. You may also be given a medication (decongestant) that helps to reduce sinus swelling.  HOME CARE INSTRUCTIONS   Only take over-the-counter or prescription medicines for pain, discomfort, or fever as directed by your caregiver.   Drink extra fluids. Fluids help thin the mucus so your sinuses can drain more easily.   Applying either moist heat or ice packs to the sinus areas may help relieve discomfort.   Use saline nasal sprays to help moisten your sinuses. The sprays can be found at your local drugstore.  SEEK IMMEDIATE MEDICAL CARE IF:  You have a fever.   You have increasing pain, severe headaches, or toothache.   You have nausea, vomiting, or drowsiness.   You develop unusual swelling around the face or trouble seeing.  MAKE SURE YOU:   Understand these instructions.   Will watch your condition.   Will get help right away if you are not doing well  or get worse.  Document Released: 03/31/2005 Document Revised: 03/20/2011 Document Reviewed: 10/28/2006 [facility] Patient Information 2012 [place], [place].

## 2011-10-21 MED ORDER — ROSUVASTATIN CALCIUM 10 MG PO TABS: 10.0000 mg | ORAL_TABLET | Freq: Every day | ORAL | Status: DC

## 2011-10-21 MED ORDER — LEVOTHYROXINE SODIUM 150 MCG PO TABS: 150.0000 ug | ORAL_TABLET | Freq: Every day | ORAL | Status: DC

## 2011-11-15 ENCOUNTER — Other Ambulatory Visit: Payer: Self-pay | Admitting: Family Medicine

## 2012-02-04 ENCOUNTER — Encounter: Payer: Self-pay | Admitting: *Deleted

## 2012-02-04 ENCOUNTER — Emergency Department
Admission: EM | Admit: 2012-02-04 | Discharge: 2012-02-04 | Disposition: A | Discharge status: Home / Self Care | Payer: Self-pay | Source: Home / Self Care | Attending: Family Medicine | Admitting: Family Medicine

## 2012-02-04 DIAGNOSIS — J0100 Acute maxillary sinusitis, unspecified: Secondary | ICD-10-CM

## 2012-02-04 MED ORDER — AZITHROMYCIN 250 MG PO TABS: ORAL_TABLET | ORAL | Status: DC

## 2012-02-04 NOTE — ED Notes (Addendum)
Patient c/o sore throat and nasal draining x 5-6 weeks. Patient c/o right upper jaw feeling sore but states he grinds teeth at night. Drainage white, painful to swallow. Has tried OTC meds with no relief. States "Amoxicillin did not work last time but Z-pack did."

## 2012-02-04 NOTE — ED Provider Notes (Signed)
History     CSN: [id number]  Arrival date & time 02/04/12  4403   First MD Initiated Contact with Patient 02/04/12 1852      Chief Complaint  Patient presents with  . Sore Throat      HPI Comments: Patient c/o sore throat and nasal drainage for 5-6 weeks. Patient c/o right upper jaw feeling sore but states he grinds teeth at night. Drainage white, painful to swallow. Has tried OTC meds with no relief. States "Amoxicillin did not work last time but Z-pack did."  No cough presently.  No fevers, chills, and sweats.  The history is provided by the patient.    Past Medical History  Diagnosis Date  . Hyperlipidemia   . Thyroid disease     Past Surgical History  Procedure Date  . Appendectomy     History reviewed. No pertinent family history.  History  Substance Use Topics  . Smoking status: Never Smoker   . Smokeless tobacco: Not on file  . Alcohol Use: No      Review of Systems + sore throat No cough No pleuritic pain No wheezing + nasal congestion + post-nasal drainage + sinus pain/pressure No itchy/red eyes No earache No hemoptysis No SOB No fever/chills No nausea No vomiting No abdominal pain No diarrhea No urinary symptoms No skin rashes No fatigue No myalgias + headache Used OTC meds without relief  Allergies  Review of patient's allergies indicates no known allergies.  Home Medications   Current Outpatient Rx  Name Route Sig Dispense Refill  . AZITHROMYCIN 250 MG PO TABS  Take 2 tabs today; then begin one tab once daily for 4 more days. 6 each 0  . CRESTOR 10 MG PO TABS  TAKE 2 TABLETS BY MOUTH AT BEDTIME . NEEDS APPT 30 tablet 0  . LEVOTHYROXINE SODIUM 150 MCG PO TABS Oral Take 1 tablet (150 mcg total) by mouth daily. 90 tablet 4  . ROSUVASTATIN CALCIUM 10 MG PO TABS Oral Take 1 tablet (10 mg total) by mouth daily. 30 tablet 11    BP 124/75  Pulse 64  Temp 97.8 F (36.6 C) (Oral)  Resp 16  Ht 6\' 2"  (1.88 m)  Wt 232 lb 4 oz (105.348  kg)  BMI 29.82 kg/m2  SpO2 99%  Physical Exam Nursing notes and Vital Signs reviewed. Appearance:  Patient appears healthy, stated age, and in no acute distress Eyes:  Pupils are equal, round, and reactive to light and accomodation.  Extraocular movement is intact.  Conjunctivae are not inflamed  Ears:  Canals normal.  Tympanic membranes normal.  Nose:  Mildly congested turbinates.  Mild maxillary sinus tenderness is present.  Pharynx:  Normal Neck:  Supple.  Slightly tender shotty anterior nodes are palpated bilaterally  Lungs:  Clear to auscultation.  Breath sounds are equal.  Heart:  Regular rate and rhythm without murmurs, rubs, or gallops.  Abdomen:  Nontender without masses or hepatosplenomegaly.  Bowel sounds are present.  No CVA or flank tenderness.  Skin:  No rash present.   ED Course  Procedures  none      1. Acute maxillary sinusitis       MDM  Begin Z-pack Take Mucinex D (guaifenesin with decongestant) twice daily for congestion.  Increase fluid intake, rest. May use Afrin nasal spray (or generic oxymetazoline) twice daily for about 5 days.  Also recommend using saline nasal spray several times daily and saline nasal irrigation (AYR is a common brand) Stop all antihistamines  for now, and other non-prescription cough/cold preparations. Follow-up with family doctor if not improving 7 to 10 days.         Lattie Haw, MD 02/04/12 [phone]

## 2012-07-02 ENCOUNTER — Telehealth: Payer: Self-pay

## 2012-07-02 NOTE — Telephone Encounter (Signed)
This is W/C will you put in [facility]?

## 2012-07-02 NOTE — Telephone Encounter (Signed)
Patient was here for foot pain and now is still having foot pain and would like someone to look at the xray again or maybe get a referral possibly  [phone]

## 2012-07-02 NOTE — Telephone Encounter (Deleted)
Patient was here for foot pain and now is still having foot pain and would like someone to look at the xray again or maybe get a referral possibly  [phone] °

## 2012-08-26 ENCOUNTER — Other Ambulatory Visit: Payer: Self-pay | Admitting: Orthopedic Surgery

## 2012-08-26 DIAGNOSIS — Q742 Other congenital malformations of lower limb(s), including pelvic girdle: Secondary | ICD-10-CM

## 2012-08-27 ENCOUNTER — Other Ambulatory Visit: Payer: Self-pay

## 2012-09-02 ENCOUNTER — Ambulatory Visit
Admission: RE | Admit: 2012-09-02 | Discharge: 2012-09-02 | Disposition: A | Discharge status: Home / Self Care | Payer: Worker's Compensation | Source: Ambulatory Visit | Attending: Orthopedic Surgery | Admitting: Orthopedic Surgery

## 2012-09-02 DIAGNOSIS — Q742 Other congenital malformations of lower limb(s), including pelvic girdle: Secondary | ICD-10-CM

## 2012-10-21 ENCOUNTER — Other Ambulatory Visit: Payer: Self-pay | Admitting: Physician Assistant

## 2012-10-28 ENCOUNTER — Other Ambulatory Visit: Payer: Self-pay | Admitting: Physician Assistant

## 2013-01-19 ENCOUNTER — Ambulatory Visit (INDEPENDENT_AMBULATORY_CARE_PROVIDER_SITE_OTHER): Payer: Self-pay | Admitting: Family Medicine

## 2013-01-19 ENCOUNTER — Encounter: Payer: Self-pay | Admitting: Family Medicine

## 2013-01-19 VITALS — BP 137/87 | HR 78 | Wt 233.0 lb

## 2013-01-19 DIAGNOSIS — E785 Hyperlipidemia, unspecified: Secondary | ICD-10-CM

## 2013-01-19 DIAGNOSIS — E039 Hypothyroidism, unspecified: Secondary | ICD-10-CM

## 2013-01-19 MED ORDER — ROSUVASTATIN CALCIUM 10 MG PO TABS: 10.0000 mg | ORAL_TABLET | Freq: Every day | ORAL | Status: DC

## 2013-01-19 MED ORDER — LEVOTHYROXINE SODIUM 150 MCG PO TABS: 150.0000 ug | ORAL_TABLET | Freq: Every day | ORAL | Status: DC

## 2013-01-19 NOTE — Progress Notes (Signed)
  Subjective:    Patient ID: Michael Wong, male    DOB: [birth year], 47 y.o.   MRN: [id number]  HPI Hyperlipidemia- no myalgias.  He takes half a tab of the crestor daily. He has been out for a month.    Hypothyroidism-No weight or skin or hair changes.  Feels like dose is working well. No chnages in sleep. Some fatigue but has a new baby.    Review of Systems     Objective:   Physical Exam  Constitutional: He is oriented to person, place, and time. He appears well-developed and well-nourished.  HENT:  Head: Normocephalic and atraumatic.  Neck: Neck supple. No thyromegaly present.  Cardiovascular: Normal rate, regular rhythm and normal heart sounds.   Pulmonary/Chest: Effort normal and breath sounds normal.  Lymphadenopathy:    He has no cervical adenopathy.  Neurological: He is alert and oriented to person, place, and time.  Skin: Skin is warm and dry.  Psychiatric: He has a normal mood and affect. His behavior is normal.          Assessment & Plan:  Hyperlipidemia- he does well on the Crestor. On the views that he can take without side effects. Refill sent to pharmacy. He's been out of it for months encouraged him to get back on a statin for week and then go to the lab to have his labs checked. Will call with results once available. Otherwise followup in one year.  Hypothyroid - he feels his symptoms are well-controlled. Recheck TSH. Refill sent to pharmacy.

## 2013-03-14 LAB — COMPLETE METABOLIC PANEL WITH GFR
ALT: 33 U/L (ref 0–53)
AST: 24 U/L (ref 0–37)
Creat: 0.97 mg/dL (ref 0.50–1.35)
GFR, Est African American: >89 mL/min
Sodium: 141 mEq/L (ref 135–145)
Total Bilirubin: 0.9 mg/dL (ref 0.3–1.2)

## 2013-03-14 LAB — LIPID PANEL
HDL: 47 mg/dL (ref >39)
LDL Cholesterol: 138 mg/dL — ABNORMAL HIGH (ref 0–99)
Triglycerides: 161 mg/dL — ABNORMAL HIGH (ref <150)
VLDL: 32 mg/dL (ref 0–40)

## 2013-03-15 ENCOUNTER — Other Ambulatory Visit: Payer: Self-pay | Admitting: Family Medicine

## 2013-03-15 ENCOUNTER — Other Ambulatory Visit: Payer: Self-pay | Admitting: *Deleted

## 2013-03-15 DIAGNOSIS — E039 Hypothyroidism, unspecified: Secondary | ICD-10-CM

## 2013-03-15 MED ORDER — LEVOTHYROXINE SODIUM 175 MCG PO TABS: 175.0000 ug | ORAL_TABLET | Freq: Every day | ORAL | Status: DC

## 2013-07-11 ENCOUNTER — Other Ambulatory Visit: Payer: Self-pay | Admitting: Family Medicine

## 2013-12-21 ENCOUNTER — Other Ambulatory Visit: Payer: Self-pay | Admitting: Family Medicine

## 2015-04-30 ENCOUNTER — Ambulatory Visit (INDEPENDENT_AMBULATORY_CARE_PROVIDER_SITE_OTHER): Payer: 59 | Admitting: Family Medicine

## 2015-04-30 ENCOUNTER — Encounter: Payer: Self-pay | Admitting: Family Medicine

## 2015-04-30 VITALS — BP 131/86 | HR 88 | Wt 254.0 lb

## 2015-04-30 DIAGNOSIS — E785 Hyperlipidemia, unspecified: Secondary | ICD-10-CM

## 2015-04-30 DIAGNOSIS — E039 Hypothyroidism, unspecified: Secondary | ICD-10-CM

## 2015-04-30 LAB — COMPLETE METABOLIC PANEL WITH GFR
ALBUMIN: 4.4 g/dL (ref 3.6–5.1)
ALK PHOS: 72 U/L (ref 40–115)
ALT: 50 U/L — ABNORMAL HIGH (ref 9–46)
AST: 32 U/L (ref 10–40)
BUN: 13 mg/dL (ref 7–25)
CHLORIDE: 100 mmol/L (ref 98–110)
CO2: 27 mmol/L (ref 20–31)
Calcium: 9.6 mg/dL (ref 8.6–10.3)
Creat: 0.96 mg/dL (ref 0.60–1.35)
GFR, Est African American: >89 mL/min (ref >=60)
GFR, Est Non African American: >89 mL/min (ref >=60)
GLUCOSE: 89 mg/dL (ref 65–99)
POTASSIUM: 4.4 mmol/L (ref 3.5–5.3)
Sodium: 136 mmol/L (ref 135–146)
Total Bilirubin: 1.4 mg/dL — ABNORMAL HIGH (ref 0.2–1.2)
Total Protein: 6.7 g/dL (ref 6.1–8.1)

## 2015-04-30 LAB — LIPID PANEL
CHOL/HDL RATIO: 5.7 ratio — AB (ref <=5.0)
Cholesterol: 222 mg/dL — ABNORMAL HIGH (ref 125–200)
HDL: 39 mg/dL — ABNORMAL LOW (ref >=40)
LDL Cholesterol: 143 mg/dL — ABNORMAL HIGH (ref <130)
Triglycerides: 200 mg/dL — ABNORMAL HIGH (ref <150)
VLDL: 40 mg/dL — ABNORMAL HIGH (ref <30)

## 2015-04-30 LAB — TSH: TSH: 5.621 u[IU]/mL — ABNORMAL HIGH (ref 0.350–4.500)

## 2015-04-30 NOTE — Progress Notes (Signed)
   Subjective:    Patient ID: Michael Wong, male    DOB: January 09, 1966, 50 y.o.   MRN: [id number]  HPI Hypothyroid - No skin or hair changes. He has gained some weight.  Gained since he got married.  No regular exercise.  No change in energy.  He has gained 20 lb in the last 2.5 years.    Hyperlipidemia - tolerating  Well.  No myalgias or side effects.  He has been taking 5mg  4 days per week.  Says he has felt better on the lower dose.     Review of Systems     Objective:   Physical Exam  Constitutional: He is oriented to person, place, and time. He appears well-developed and well-nourished.  HENT:  Head: Normocephalic and atraumatic.  Neck: Neck supple. No thyromegaly present.  Cardiovascular: Normal rate, regular rhythm and normal heart sounds.   Pulmonary/Chest: Effort normal and breath sounds normal.  Lymphadenopathy:    He has no cervical adenopathy.  Neurological: He is alert and oriented to person, place, and time.  Skin: Skin is warm and dry.  Psychiatric: He has a normal mood and affect. His behavior is normal.          Assessment & Plan:  Hypothyroid - he is asymptomatic. Due to recheck TSH. Will call if we need to make any adjustments. He's been on the same dose for several years that it looks great I will just see him back in one year.  Hyperlpidemia - will see if his levels are at goal. If not he will need to go back to half a tab daily instead of just 4 days per week. He's otherwise tolerated it well. Will check liver enzymes as well.

## 2015-05-02 ENCOUNTER — Other Ambulatory Visit: Payer: Self-pay | Admitting: Family Medicine

## 2015-05-02 DIAGNOSIS — R748 Abnormal levels of other serum enzymes: Secondary | ICD-10-CM

## 2015-05-02 DIAGNOSIS — E039 Hypothyroidism, unspecified: Secondary | ICD-10-CM

## 2015-05-02 MED ORDER — LEVOTHYROXINE SODIUM 175 MCG PO TABS: ORAL_TABLET | ORAL | Status: DC

## 2015-05-02 MED ORDER — ROSUVASTATIN CALCIUM 10 MG PO TABS: 10.0000 mg | ORAL_TABLET | Freq: Every day | ORAL | Status: DC

## 2015-05-09 ENCOUNTER — Telehealth: Payer: Self-pay | Admitting: Family Medicine

## 2015-05-09 NOTE — Telephone Encounter (Signed)
Pt called to see if it was OK to take aspirin for aches/pains? At his last lab work his liver function was elevated so wanted to check with PCP first.

## 2015-05-09 NOTE — Telephone Encounter (Signed)
Notified patient that it is ok to take aspirin.

## 2015-05-09 NOTE — Telephone Encounter (Signed)
Yes, okay to use aspirin.

## 2015-08-06 ENCOUNTER — Other Ambulatory Visit: Payer: Self-pay | Admitting: Family Medicine

## 2015-08-06 DIAGNOSIS — R17 Unspecified jaundice: Secondary | ICD-10-CM

## 2015-08-07 LAB — COMPLETE METABOLIC PANEL WITH GFR
ALT: 34 U/L (ref 9–46)
AST: 22 U/L (ref 10–40)
Albumin: 4.2 g/dL (ref 3.6–5.1)
Alkaline Phosphatase: 64 U/L (ref 40–115)
BUN: 13 mg/dL (ref 7–25)
CALCIUM: 9 mg/dL (ref 8.6–10.3)
CHLORIDE: 104 mmol/L (ref 98–110)
CO2: 27 mmol/L (ref 20–31)
Creat: 0.89 mg/dL (ref 0.60–1.35)
Glucose, Bld: 93 mg/dL (ref 65–99)
POTASSIUM: 4.8 mmol/L (ref 3.5–5.3)
Sodium: 140 mmol/L (ref 135–146)
Total Bilirubin: 1.5 mg/dL — ABNORMAL HIGH (ref 0.2–1.2)
Total Protein: 6.4 g/dL (ref 6.1–8.1)

## 2015-08-07 LAB — BILIRUBIN, FRACTIONATED(TOT/DIR/INDIR)
BILIRUBIN DIRECT: 0.2 mg/dL (ref <=0.2)
Indirect Bilirubin: 1.2 mg/dL (ref 0.2–1.2)
Total Bilirubin: 1.4 mg/dL — ABNORMAL HIGH (ref 0.2–1.2)

## 2015-08-07 LAB — LIPID PANEL
CHOL/HDL RATIO: 5.5 ratio — AB (ref <=5.0)
CHOLESTEROL: 197 mg/dL (ref 125–200)
HDL: 36 mg/dL — AB (ref >=40)
LDL Cholesterol: 126 mg/dL (ref <130)
TRIGLYCERIDES: 175 mg/dL — AB (ref <150)
VLDL: 35 mg/dL — ABNORMAL HIGH (ref <30)

## 2015-08-07 LAB — TSH: TSH: 2.4 m[IU]/L (ref 0.40–4.50)

## 2015-08-09 NOTE — Addendum Note (Signed)
Addended by: Teddy Spike on: 08/09/2015 06:19 PM   Modules accepted: Orders

## 2015-11-21 ENCOUNTER — Other Ambulatory Visit: Payer: Self-pay | Admitting: Family Medicine

## 2016-02-17 ENCOUNTER — Other Ambulatory Visit: Payer: Self-pay | Admitting: Family Medicine

## 2016-03-03 ENCOUNTER — Encounter: Payer: Self-pay | Admitting: Sports Medicine

## 2016-03-03 ENCOUNTER — Ambulatory Visit (INDEPENDENT_AMBULATORY_CARE_PROVIDER_SITE_OTHER): Payer: 59 | Admitting: Sports Medicine

## 2016-03-03 ENCOUNTER — Ambulatory Visit (INDEPENDENT_AMBULATORY_CARE_PROVIDER_SITE_OTHER): Payer: 59

## 2016-03-03 DIAGNOSIS — K4090 Unilateral inguinal hernia, without obstruction or gangrene, not specified as recurrent: Secondary | ICD-10-CM

## 2016-03-03 LAB — POCT URINALYSIS DIPSTICK
Glucose, UA: NEGATIVE
Leukocytes, UA: NEGATIVE
Nitrite, UA: NEGATIVE
Protein, UA: NEGATIVE
Spec Grav, UA: 1.015
Urobilinogen, UA: 0.2
pH, UA: 5.5

## 2016-03-03 MED ORDER — IOPAMIDOL (ISOVUE-300) INJECTION 61%
100.0000 mL | Freq: Once | INTRAVENOUS | Status: AC | PRN
  Administered 2016-03-03: 100 mL via INTRAVENOUS

## 2016-03-03 NOTE — Progress Notes (Signed)
  Subjective:    CC: Multiple issues  HPI: For several weeks this pleasant 50 year old male with a history of hyperbilirubinemia has had fatigue, yellowing of the eyes, excessive hiccuping, dark urine, light-colored stools. Symptoms are moderate, persistent. He also has abdominal pain, epigastric. No vomiting, diarrhea, he is itchy all over his body.  Past medical history:  Negative.  See flowsheet/record as well for more information.  Surgical history: Negative.  See flowsheet/record as well for more information.  Family history: Negative.  See flowsheet/record as well for more information.  Social history: Negative.  See flowsheet/record as well for more information.  Allergies, and medications have been entered into the medical record, reviewed, and no changes needed.   Review of Systems: No fevers, chills, night sweats, weight loss, chest pain, or shortness of breath.   Objective:    General: Well Developed, well nourished, and in no acute distress.  Neuro: Alert and oriented x3, extra-ocular muscles intact, sensation grossly intact.  HEENT: Normocephalic, atraumatic, pupils equal round reactive to light, neck supple, no masses, no lymphadenopathy, thyroid nonpalpable. Sclerae are mildly icteric, nasopharynx, ear canals unremarkable, oropharynx unremarkable Skin: Warm and dry, no rashes. Cardiac: Regular rate and rhythm, no murmurs rubs or gallops, no lower extremity edema.  Respiratory: Clear to auscultation bilaterally. Not using accessory muscles, speaking in full sentences. Abdomen: Soft, tender to palpation in the epigastrium, nondistended, normal bowel sounds, no palpable masses, mild guarding, no rigidity. Rectal: Good tone, smooth prostate, Hemoccult positive.  Impression and Recommendations:    Hyperbilirubinemia With pale stools, dark urine. This is fairly nonspecific, we are going to do a fairly large workup looking for gallbladder and biliary pathology, hemolysis, GI  bleed. Rhabdomyolysis can also create these type of nonspecific symptoms. Adding abdominal ultrasound. Also has significant hiccuping, I have seen this with diaphragmatic  And paradiaphragmatic pathology, he does have significant abdominal pain with guarding, also doing a CT of the abdomen and pelvis with oral and IV contrast. He is Hemoccult positive as well, referral to gastroenterology.  I spent 40 minutes with this patient, greater than 50% was face-to-face time counseling regarding the above diagnoses

## 2016-03-03 NOTE — Assessment & Plan Note (Addendum)
With pale stools, dark urine. This is fairly nonspecific, we are going to do a fairly large workup looking for gallbladder and biliary pathology, hemolysis, GI bleed. Rhabdomyolysis can also create these type of nonspecific symptoms. Adding abdominal ultrasound. Also has significant hiccuping, I have seen this with diaphragmatic  And paradiaphragmatic pathology, he does have significant abdominal pain with guarding, also doing a CT of the abdomen and pelvis with oral and IV contrast. He is Hemoccult positive as well, referral to gastroenterology.  Also with moderate transaminitis, adding hepatitis virus testing, if unrevealing ultrasound then we can add other tests to work up hepatitis.  Hepatitis virus testing is negative, CT and ultrasound to simply show hepatic steatosis. Adding more comprehensive hepatitis panels.

## 2016-03-03 NOTE — Addendum Note (Signed)
Addended by: Elizabeth Sauer on: 03/03/2016 04:35 PM   Modules accepted: Orders

## 2016-03-04 ENCOUNTER — Ambulatory Visit (INDEPENDENT_AMBULATORY_CARE_PROVIDER_SITE_OTHER): Payer: 59

## 2016-03-04 LAB — CBC WITH DIFFERENTIAL/PLATELET
Basophils Absolute: 0 cells/uL (ref 0–200)
Basophils Relative: 0 %
Eosinophils Absolute: 282 {cells}/uL (ref 15–500)
Eosinophils Relative: 6 %
HCT: 43.8 % (ref 38.5–50.0)
Hemoglobin: 14.7 g/dL (ref 13.2–17.1)
Lymphocytes Relative: 40 %
Lymphs Abs: 1880 {cells}/uL (ref 850–3900)
MCH: 29 pg (ref 27.0–33.0)
MCHC: 33.6 g/dL (ref 32.0–36.0)
MCV: 86.4 fL (ref 80.0–100.0)
MPV: 9.4 fL (ref 7.5–12.5)
Monocytes Absolute: 611 {cells}/uL (ref 200–950)
Monocytes Relative: 13 %
Neutro Abs: 1927 cells/uL (ref 1500–7800)
Neutrophils Relative %: 41 %
Platelets: 262 10*3/uL (ref 140–400)
RBC: 5.07 MIL/uL (ref 4.20–5.80)
RDW: 14.2 % (ref 11.0–15.0)
WBC: 4.7 K/uL (ref 3.8–10.8)

## 2016-03-04 LAB — BILIRUBIN, FRACTIONATED(TOT/DIR/INDIR)
Bilirubin, Direct: 2.8 mg/dL — ABNORMAL HIGH (ref <=0.2)
Indirect Bilirubin: 2.4 mg/dL — ABNORMAL HIGH (ref 0.2–1.2)
Total Bilirubin: 5.2 mg/dL — ABNORMAL HIGH (ref 0.2–1.2)

## 2016-03-04 LAB — TSH: TSH: 0.74 mIU/L (ref 0.40–4.50)

## 2016-03-04 LAB — COMPREHENSIVE METABOLIC PANEL WITH GFR
ALT: 417 U/L — ABNORMAL HIGH (ref 9–46)
AST: 165 U/L — ABNORMAL HIGH (ref 10–40)
Albumin: 4.4 g/dL (ref 3.6–5.1)
Alkaline Phosphatase: 201 U/L — ABNORMAL HIGH (ref 40–115)
Calcium: 9.5 mg/dL (ref 8.6–10.3)
Potassium: 4.2 mmol/L (ref 3.5–5.3)
Total Bilirubin: 5.2 mg/dL — ABNORMAL HIGH (ref 0.2–1.2)

## 2016-03-04 LAB — COMPREHENSIVE METABOLIC PANEL
BUN: 11 mg/dL (ref 7–25)
CO2: 30 mmol/L (ref 20–31)
Chloride: 103 mmol/L (ref 98–110)
Creat: 0.78 mg/dL (ref 0.60–1.35)
Glucose, Bld: 103 mg/dL — ABNORMAL HIGH (ref 65–99)
Sodium: 140 mmol/L (ref 135–146)
Total Protein: 7 g/dL (ref 6.1–8.1)

## 2016-03-04 LAB — CK: Total CK: 185 U/L (ref 7–232)

## 2016-03-04 NOTE — Addendum Note (Signed)
Addended by: Silverio Decamp on: 03/04/2016 08:20 AM   Modules accepted: Orders

## 2016-03-05 ENCOUNTER — Telehealth: Payer: Self-pay

## 2016-03-05 ENCOUNTER — Other Ambulatory Visit: Payer: Self-pay

## 2016-03-05 LAB — HEPATITIS PANEL, ACUTE
HCV Ab: NEGATIVE
Hep A IgM: NONREACTIVE
Hep B C IgM: NONREACTIVE
Hepatitis B Surface Ag: NEGATIVE

## 2016-03-05 LAB — PATHOLOGIST SMEAR REVIEW

## 2016-03-05 MED ORDER — HYDROXYZINE HCL 50 MG PO TABS: 50.0000 mg | ORAL_TABLET | Freq: Three times a day (TID) | ORAL | 3 refills | Status: DC | PRN

## 2016-03-05 MED ORDER — PREDNISONE 50 MG PO TABS: 50.0000 mg | ORAL_TABLET | Freq: Every day | ORAL | 0 refills | Status: DC

## 2016-03-05 NOTE — Telephone Encounter (Signed)
Pt wife left VM stating pt is really ill today. He's still itching/scratching to the point of bleeding and vomiting. Wife would like to know what they should do next. Please advise.

## 2016-03-05 NOTE — Addendum Note (Signed)
Addended by: Silverio Decamp on: 03/05/2016 09:09 AM   Modules accepted: Orders

## 2016-03-05 NOTE — Telephone Encounter (Signed)
Have him come in today to get his new set of blood work done, before starting prednisone and hydroxyzine so that these medications which will stop the itching, do not interfere with his test results.  He should come by and pick up the prescription after his blood work is done.

## 2016-03-05 NOTE — Telephone Encounter (Signed)
Spoke with pt wife informed of labs and medications. Wife stated they were in the facility at this moment to get labs done before they picked up medications.

## 2016-03-06 LAB — ANTI-SMOOTH MUSCLE ANTIBODY, IGG: Smooth Muscle Ab: <20 U (ref <20)

## 2016-03-06 LAB — FERRITIN: Ferritin: 294 ng/mL (ref 20–380)

## 2016-03-07 LAB — ALPHA-1-ANTITRYPSIN: A-1 Antitrypsin, Ser: 106 mg/dL (ref 83–199)

## 2016-03-07 LAB — CERULOPLASMIN: Ceruloplasmin: 38 mg/dL — ABNORMAL HIGH (ref 18–36)

## 2016-03-10 ENCOUNTER — Ambulatory Visit (INDEPENDENT_AMBULATORY_CARE_PROVIDER_SITE_OTHER): Payer: 59 | Admitting: Sports Medicine

## 2016-03-10 ENCOUNTER — Encounter: Payer: Self-pay | Admitting: Sports Medicine

## 2016-03-10 NOTE — Progress Notes (Signed)
  Subjective:    CC: Followup  HPI:  This is a pleasant 50 year old male, he had transaminitis, elevated alkaline phosphatase, hyperbilirubinemia, with an overall unremarkable CT and ultrasound of the abdomen. We started prednisone, hydroxyzine, he was Hemoccult positive as well, he has an appointment coming up with gastroenterology. Overall is starting to feel better, less jaundice of the eyes, less itching.  Past medical history:  Negative.  See flowsheet/record as well for more information.  Surgical history: Negative.  See flowsheet/record as well for more information.  Family history: Negative.  See flowsheet/record as well for more information.  Social history: Negative.  See flowsheet/record as well for more information.  Allergies, and medications have been entered into the medical record, reviewed, and no changes needed.   Review of Systems: No fevers, chills, night sweats, weight loss, chest pain, or shortness of breath.   Objective:    General: Well Developed, well nourished, and in no acute distress.  Neuro: Alert and oriented x3, extra-ocular muscles intact, sensation grossly intact.  HEENT: Normocephalic, atraumatic, pupils equal round reactive to light, neck supple, no masses, no lymphadenopathy, thyroid nonpalpable.  Skin: Warm and dry, no rashes. Cardiac: Regular rate and rhythm, no murmurs rubs or gallops, no lower extremity edema.  Respiratory: Clear to auscultation bilaterally. Not using accessory muscles, speaking in full sentences.  Impression and Recommendations:    Hyperbilirubinemia Starting to improve, does have a follow-up with gastroenterology

## 2016-03-10 NOTE — Assessment & Plan Note (Signed)
Starting to improve, does have a follow-up with gastroenterology

## 2016-03-11 LAB — ANTI-MICROSOMAL ANTIBODY LIVER / KIDNEY: LKM1 Ab: <=20 U (ref <=20.0)

## 2016-05-23 ENCOUNTER — Other Ambulatory Visit: Payer: Self-pay | Admitting: Family Medicine

## 2016-06-02 ENCOUNTER — Other Ambulatory Visit: Payer: Self-pay | Admitting: Family Medicine

## 2016-06-25 ENCOUNTER — Telehealth: Payer: Self-pay | Admitting: Family Medicine

## 2016-06-25 NOTE — Telephone Encounter (Signed)
Left a message for patient to schedule a f/u appt with Dr.Metheney for F/u on Cholesterol/Thyroid and labs

## 2016-09-02 ENCOUNTER — Other Ambulatory Visit: Payer: Self-pay | Admitting: *Deleted

## 2016-09-02 DIAGNOSIS — E785 Hyperlipidemia, unspecified: Secondary | ICD-10-CM

## 2016-09-02 DIAGNOSIS — E039 Hypothyroidism, unspecified: Secondary | ICD-10-CM

## 2016-09-02 LAB — COMPLETE METABOLIC PANEL WITH GFR
ALT: 32 U/L (ref 9–46)
AST: 33 U/L (ref 10–40)
Albumin: 4.6 g/dL (ref 3.6–5.1)
Alkaline Phosphatase: 98 U/L (ref 40–115)
BUN: 15 mg/dL (ref 7–25)
CALCIUM: 9.6 mg/dL (ref 8.6–10.3)
CHLORIDE: 105 mmol/L (ref 98–110)
CO2: 25 mmol/L (ref 20–31)
Creat: 1.14 mg/dL (ref 0.60–1.35)
GFR, EST AFRICAN AMERICAN: 87 mL/min (ref >=60)
GFR, Est Non African American: 76 mL/min (ref >=60)
Glucose, Bld: 91 mg/dL (ref 65–99)
POTASSIUM: 4.6 mmol/L (ref 3.5–5.3)
Sodium: 141 mmol/L (ref 135–146)
Total Bilirubin: 1.3 mg/dL — ABNORMAL HIGH (ref 0.2–1.2)
Total Protein: 7.1 g/dL (ref 6.1–8.1)

## 2016-09-02 LAB — LIPID PANEL W/REFLEX DIRECT LDL
CHOL/HDL RATIO: 4.2 ratio (ref <5.0)
CHOLESTEROL: 144 mg/dL (ref <200)
HDL: 34 mg/dL — ABNORMAL LOW (ref >40)
LDL-Cholesterol: 89 mg/dL
NON-HDL CHOLESTEROL (CALC): 110 mg/dL (ref <130)
TRIGLYCERIDES: 117 mg/dL (ref <150)

## 2016-09-03 LAB — TSH: TSH: 6.61 m[IU]/L — AB (ref 0.40–4.50)

## 2016-09-05 ENCOUNTER — Ambulatory Visit (INDEPENDENT_AMBULATORY_CARE_PROVIDER_SITE_OTHER): Payer: 59 | Admitting: Family Medicine

## 2016-09-05 ENCOUNTER — Encounter: Payer: Self-pay | Admitting: Family Medicine

## 2016-09-05 ENCOUNTER — Telehealth: Payer: Self-pay | Admitting: Family Medicine

## 2016-09-05 VITALS — BP 112/66 | HR 72 | Wt 233.0 lb

## 2016-09-05 DIAGNOSIS — E039 Hypothyroidism, unspecified: Secondary | ICD-10-CM

## 2016-09-05 DIAGNOSIS — R17 Unspecified jaundice: Secondary | ICD-10-CM

## 2016-09-05 DIAGNOSIS — E785 Hyperlipidemia, unspecified: Secondary | ICD-10-CM | POA: Diagnosis not present

## 2016-09-05 MED ORDER — ROSUVASTATIN CALCIUM 10 MG PO TABS: 10.0000 mg | ORAL_TABLET | Freq: Every day | ORAL | 3 refills | Status: DC

## 2016-09-05 MED ORDER — LEVOTHYROXINE SODIUM 175 MCG PO TABS: ORAL_TABLET | ORAL | 1 refills | Status: DC

## 2016-09-05 NOTE — Patient Instructions (Signed)
Recheck labs in 8 weeks for thyroid.

## 2016-09-05 NOTE — Telephone Encounter (Signed)
Left VM for Pt advising PCP would like to go over labs in an office visit. Callback provided for Pt to schedule.

## 2016-09-05 NOTE — Progress Notes (Signed)
   Subjective:    Patient ID: Michael Wong, male    DOB: 06/08/1965, 51 y.o.   MRN: [id number]  HPI Hypothyroidism-no recent skin or hair changes. No significant changes in weight. He's happy with his current regimen of levothyroxin 175 g daily. TSH was slightly elevated. Lab Results  Component Value Date   TSH 6.61 (H) 09/02/2016    Elevated liver enzymes-he went his liver enzymes actually came down. He had an episode last fall with a think he may have passed a gallstone. Call significant elevation in bilirubin and liver.  Hyperlipidemia-needs refill on Crestor. Tolerates well without any side effects or myalgias.   Review of Systems   BP 112/66   Pulse 72   Wt 233 lb (105.7 kg)   BMI 29.92 kg/m     No Known Allergies  Past Medical History:  Diagnosis Date  . Hyperlipidemia   . Thyroid disease     Past Surgical History:  Procedure Laterality Date  . APPENDECTOMY      Social History   Social History  . Marital status: Married    Spouse name: N/A  . Number of children: N/A  . Years of education: N/A   Occupational History  . Not on file.   Social History Main Topics  . Smoking status: Never Smoker  . Smokeless tobacco: Never Used  . Alcohol use No  . Drug use: No  . Sexual activity: Not on file     Comment: BS degree, single, 2 kids,caffeine2-3 drinks daily, no exercise   Other Topics Concern  . Not on file   Social History Narrative  . No narrative on file    No family history on file.  Outpatient Encounter Prescriptions as of 09/05/2016  Medication Sig  . levothyroxine (SYNTHROID, LEVOTHROID) 175 MCG tablet TAKE ONE TABLET BY MOUTH BEFORE BREAKFAST. Take extra 1/2 tab one day a week  . rosuvastatin (CRESTOR) 10 MG tablet Take 1 tablet (10 mg total) by mouth daily.  . [DISCONTINUED] levothyroxine (SYNTHROID, LEVOTHROID) 175 MCG tablet TAKE ONE TABLET BY MOUTH BEFORE BREAKFAST  . [DISCONTINUED] rosuvastatin (CRESTOR) 10 MG tablet Take 1 tablet  (10 mg total) by mouth daily. LAST REFILL. APPOINTMENT REQUIRED FOR FUTURE REFILLS   No facility-administered encounter medications on file as of 09/05/2016.           Objective:   Physical Exam  Constitutional: He is oriented to person, place, and time. He appears well-developed and well-nourished.  HENT:  Head: Normocephalic and atraumatic.  Neck: Neck supple. No thyromegaly present.  Cardiovascular: Normal rate, regular rhythm and normal heart sounds.   Pulmonary/Chest: Effort normal and breath sounds normal.  Lymphadenopathy:    He has no cervical adenopathy.  Neurological: He is alert and oriented to person, place, and time.  Skin: Skin is warm and dry.  Psychiatric: He has a normal mood and affect. His behavior is normal.          Assessment & Plan:  Hypothyroid - Increased extra half a tab one day a week. Will recheck labs in 8 weeks.  Hyperlipidemia-refill Crestor for 1 year. Labs updated.  Elevated liver enzymes-bilirubin almost back down to normal. AST and ALTs normal.

## 2016-09-05 NOTE — Telephone Encounter (Signed)
Patient called to follow up on his recent labs. He was unsure if he needs to make an appointment or if he was okay to get refills/additional labs without an appointment. Please advise. Patient prefers to be called on his work phone during the day [phone]. Thanks!

## 2016-11-14 ENCOUNTER — Other Ambulatory Visit: Payer: Self-pay | Admitting: Family Medicine

## 2017-03-09 ENCOUNTER — Telehealth: Payer: Self-pay

## 2017-03-09 NOTE — Telephone Encounter (Signed)
Pt came in today and wants to have his liver rechecked. Pt informed that his insurance may not cover it due to having labs done in May. Pt was ok with doing lab anyway. CMP with GFR order was placed per pt request..

## 2017-03-13 LAB — COMPLETE METABOLIC PANEL WITH GFR
AG RATIO: 1.7 (calc) (ref 1.0–2.5)
ALT: 38 U/L (ref 9–46)
AST: 31 U/L (ref 10–40)
Albumin: 4.3 g/dL (ref 3.6–5.1)
Alkaline phosphatase (APISO): 75 U/L (ref 40–115)
BUN: 11 mg/dL (ref 7–25)
CALCIUM: 9.3 mg/dL (ref 8.6–10.3)
CO2: 26 mmol/L (ref 20–32)
CREATININE: 0.9 mg/dL (ref 0.60–1.35)
Chloride: 103 mmol/L (ref 98–110)
GFR, EST AFRICAN AMERICAN: 116 mL/min/{1.73_m2} (ref >=60)
GFR, EST NON AFRICAN AMERICAN: 100 mL/min/{1.73_m2} (ref >=60)
Globulin: 2.5 g/dL (calc) (ref 1.9–3.7)
Glucose, Bld: 80 mg/dL (ref 65–99)
Potassium: 4.1 mmol/L (ref 3.5–5.3)
Sodium: 137 mmol/L (ref 135–146)
TOTAL PROTEIN: 6.8 g/dL (ref 6.1–8.1)
Total Bilirubin: 1 mg/dL (ref 0.2–1.2)

## 2017-03-13 NOTE — Telephone Encounter (Signed)
All labs are normal. 

## 2017-05-22 ENCOUNTER — Other Ambulatory Visit: Payer: Self-pay | Admitting: *Deleted

## 2017-05-22 DIAGNOSIS — E039 Hypothyroidism, unspecified: Secondary | ICD-10-CM

## 2017-05-22 MED ORDER — LEVOTHYROXINE SODIUM 175 MCG PO TABS: ORAL_TABLET | ORAL | 0 refills | Status: DC

## 2017-06-25 ENCOUNTER — Telehealth: Payer: Self-pay | Admitting: Family Medicine

## 2017-06-25 NOTE — Telephone Encounter (Signed)
I called pt and left a voicemail for him to schedule a f/u on thyroid with Dr. Madilyn Fireman

## 2017-07-06 ENCOUNTER — Other Ambulatory Visit: Payer: Self-pay | Admitting: Family Medicine

## 2017-07-06 ENCOUNTER — Telehealth: Payer: Self-pay | Admitting: Family Medicine

## 2017-07-06 DIAGNOSIS — E039 Hypothyroidism, unspecified: Secondary | ICD-10-CM

## 2017-07-06 NOTE — Progress Notes (Signed)
Order placed for TSH labs.

## 2017-07-06 NOTE — Telephone Encounter (Signed)
Order placed for TSH. Pt is due for annual exam.

## 2017-07-06 NOTE — Telephone Encounter (Signed)
Patient came in to request a refill on his levothyroxine. He ran out of meds yesterday. He has an appointment scheduled for Thursday to follow up. He would also like blood work drawn before then. Please advise. Thank you!

## 2017-07-09 ENCOUNTER — Ambulatory Visit: Payer: Self-pay | Admitting: Family Medicine

## 2017-07-17 ENCOUNTER — Encounter: Payer: Self-pay | Admitting: Family Medicine

## 2017-07-17 ENCOUNTER — Ambulatory Visit: Payer: 59 | Admitting: Family Medicine

## 2017-07-17 VITALS — BP 134/75 | HR 76 | Ht 72.0 in | Wt 243.0 lb

## 2017-07-17 DIAGNOSIS — E785 Hyperlipidemia, unspecified: Secondary | ICD-10-CM

## 2017-07-17 DIAGNOSIS — E039 Hypothyroidism, unspecified: Secondary | ICD-10-CM | POA: Diagnosis not present

## 2017-07-17 DIAGNOSIS — Z23 Encounter for immunization: Secondary | ICD-10-CM

## 2017-07-17 MED ORDER — ROSUVASTATIN CALCIUM 10 MG PO TABS: 10.0000 mg | ORAL_TABLET | Freq: Every day | ORAL | 3 refills | Status: DC

## 2017-07-17 NOTE — Progress Notes (Signed)
Subjective:    Patient ID: Michael Wong, male    DOB: [birth year], 52 y.o.   MRN: [id number]  HPI 52 year old male is here today to follow-up for hypothyroidism.  Last seen almost a year ago.  We were actually supposed to recheck his thyroid in November but when the labs were ordered we forgot to add a thyroid level.  He actually feels like he is been doing well though.  He does often take a multivitamin around the time that he takes thyroid medication but says it does not have iron in it.  Been taking an extra half a tab on Saturdays. Lab Results  Component Value Date   TSH 6.61 (H) 09/02/2016     Hyperlipidemia-he is taking his Crestor regularly without any side effects or problems.  Last year he had problems with elevated liver enzymes but they did go back down to normal.  Does occasionally take Tylenol for joint pain. Lab Results  Component Value Date   CHOL 144 09/02/2016   HDL 34 (L) 09/02/2016   LDLCALC 126 08/06/2015   TRIG 117 09/02/2016   CHOLHDL 4.2 09/02/2016    He also reports that he is been having problems on and off with his neck.  Is been going on for couple of years.  In fact he has actually sought chiropractic care in the past.  He is tried muscle relaxers and does not like the way they make him feel.  Usually he will just start doing some stretches and after about a week or 2 it finally eases off.  He was recently traveling him that, got it flared up this time.  But he will use anti-inflammatories and Tylenol frequently during a short period of time when it is inflamed.  The neck pain then triggers headaches.  Review of Systems  BP 134/75   Pulse 76   Ht 6' (1.829 m)   Wt 243 lb (110.2 kg)   SpO2 99%   BMI 32.96 kg/m     Allergies  Allergen Reactions  . Atorvastatin     Other reaction(s): Other Muscle aches and fatigue  . Pravastatin     Other reaction(s): Other headaches    Past Medical History:  Diagnosis Date  . Hyperlipidemia   . Thyroid  disease     Past Surgical History:  Procedure Laterality Date  . APPENDECTOMY      Social History   Socioeconomic History  . Marital status: Married    Spouse name: Not on file  . Number of children: Not on file  . Years of education: Not on file  . Highest education level: Not on file  Occupational History  . Not on file  Social Needs  . Financial resource strain: Not on file  . Food insecurity:    Worry: Not on file    Inability: Not on file  . Transportation needs:    Medical: Not on file    Non-medical: Not on file  Tobacco Use  . Smoking status: Never Smoker  . Smokeless tobacco: Never Used  Substance and Sexual Activity  . Alcohol use: No  . Drug use: No  . Sexual activity: Not on file    Comment: BS degree, single, 2 kids,caffeine2-3 drinks daily, no exercise  Lifestyle  . Physical activity:    Days per week: Not on file    Minutes per session: Not on file  . Stress: Not on file  Relationships  . Social connections:    Talks  on phone: Not on file    Gets together: Not on file    Attends religious service: Not on file    Active member of club or organization: Not on file    Attends meetings of clubs or organizations: Not on file    Relationship status: Not on file  . Intimate partner violence:    Fear of current or ex partner: Not on file    Emotionally abused: Not on file    Physically abused: Not on file    Forced sexual activity: Not on file  Other Topics Concern  . Not on file  Social History Narrative  . Not on file    No family history on file.  Outpatient Encounter Medications as of 07/17/2017  Medication Sig  . levothyroxine (SYNTHROID, LEVOTHROID) 175 MCG tablet TAKE ONE TABLET BY MOUTH BEFORE BREAKFAST. Take extra 1/2 tab one day a week. LAST REFILL. YOU MUST COME IN FOR LABS  . rosuvastatin (CRESTOR) 10 MG tablet Take 1 tablet (10 mg total) by mouth daily.  . [DISCONTINUED] rosuvastatin (CRESTOR) 10 MG tablet Take 1 tablet (10 mg total)  by mouth daily.   No facility-administered encounter medications on file as of 07/17/2017.          Objective:   Physical Exam  Constitutional: He is oriented to person, place, and time. He appears well-developed and well-nourished.  HENT:  Head: Normocephalic and atraumatic.  Neck: Neck supple. No thyromegaly present.  Cardiovascular: Normal rate, regular rhythm and normal heart sounds.  Pulmonary/Chest: Effort normal and breath sounds normal.  Lymphadenopathy:    He has no cervical adenopathy.  Neurological: He is alert and oriented to person, place, and time.  Skin: Skin is warm and dry.  Psychiatric: He has a normal mood and affect. His behavior is normal.        Assessment & Plan:  Hypothyroidism- recheck TSH and adjust dose if needed.  Will call with results once available.  Hopefully the TSH looks great.  Hyperlipidemia- last lipid looked great. Due for liver check.  He does not drink alcohol.  And only uses Tylenol occasionally for joint pain.  Cervical pain-we discussed options including possible referral to physical therapy.  At this point he will just stick with conservative therapy and let us know if it is becoming more problematic or more frequent.  Show him a couple of exercises to do to see if it is helpful.

## 2017-07-18 LAB — COMPLETE METABOLIC PANEL WITH GFR
AG Ratio: 1.8 (calc) (ref 1.0–2.5)
ALBUMIN MSPROF: 4.4 g/dL (ref 3.6–5.1)
ALKALINE PHOSPHATASE (APISO): 74 U/L (ref 40–115)
ALT: 34 U/L (ref 9–46)
AST: 27 U/L (ref 10–40)
BUN: 13 mg/dL (ref 7–25)
CHLORIDE: 104 mmol/L (ref 98–110)
CO2: 31 mmol/L (ref 20–32)
CREATININE: 0.96 mg/dL (ref 0.60–1.35)
Calcium: 9.6 mg/dL (ref 8.6–10.3)
GFR, Est African American: 107 mL/min/{1.73_m2} (ref >=60)
GFR, Est Non African American: 92 mL/min/{1.73_m2} (ref >=60)
GLOBULIN: 2.4 g/dL (ref 1.9–3.7)
GLUCOSE: 86 mg/dL (ref 65–99)
Potassium: 4.5 mmol/L (ref 3.5–5.3)
SODIUM: 141 mmol/L (ref 135–146)
Total Bilirubin: 1.4 mg/dL — ABNORMAL HIGH (ref 0.2–1.2)
Total Protein: 6.8 g/dL (ref 6.1–8.1)

## 2017-07-18 LAB — TSH: TSH: 1.66 mIU/L (ref 0.40–4.50)

## 2017-09-08 ENCOUNTER — Other Ambulatory Visit: Payer: Self-pay | Admitting: Family Medicine

## 2017-09-09 ENCOUNTER — Encounter: Payer: Self-pay | Admitting: Family Medicine

## 2017-09-09 ENCOUNTER — Ambulatory Visit (INDEPENDENT_AMBULATORY_CARE_PROVIDER_SITE_OTHER): Payer: 59

## 2017-09-09 ENCOUNTER — Ambulatory Visit: Payer: 59 | Admitting: Family Medicine

## 2017-09-09 VITALS — BP 142/88 | HR 84 | Temp 97.9°F | Ht 74.0 in | Wt 241.0 lb

## 2017-09-09 DIAGNOSIS — Z0189 Encounter for other specified special examinations: Secondary | ICD-10-CM

## 2017-09-09 DIAGNOSIS — M542 Cervicalgia: Secondary | ICD-10-CM

## 2017-09-09 DIAGNOSIS — J029 Acute pharyngitis, unspecified: Secondary | ICD-10-CM

## 2017-09-09 DIAGNOSIS — M50322 Other cervical disc degeneration at C5-C6 level: Secondary | ICD-10-CM

## 2017-09-09 LAB — POCT RAPID STREP A (OFFICE): Rapid Strep A Screen: NEGATIVE

## 2017-09-09 MED ORDER — AMOXICILLIN 500 MG PO CAPS: 500.0000 mg | ORAL_CAPSULE | Freq: Three times a day (TID) | ORAL | 0 refills | Status: DC

## 2017-09-09 MED ORDER — CYCLOBENZAPRINE HCL 10 MG PO TABS: 10.0000 mg | ORAL_TABLET | Freq: Two times a day (BID) | ORAL | 0 refills | Status: DC | PRN

## 2017-09-09 NOTE — Progress Notes (Signed)
Michael Wong is a 52 y.o. male who presents to [facility] [place] today for sinus congestion and neck pain.  [place] notes initial onset of sore throat shortly after he was exposed to strep throat.  His son has strep throat and drank out of his drink.  At that time he developed a sore throat which has now resolved.  He does note however nasal congestion and runny nose.  He denies fevers or chills vomiting or diarrhea.  However he does note that he is traveling to the [place] this week and is worried about being sick while away and on vacation.  Additionally he notes neck pain.  He notes pain at the inferior cervical spine ongoing now for 4 months.  He has had pain intermittently in this area however last several months is been worsening somewhat.  He notes the pain is moderate in intensity at times.  He denies any radiating pain weakness or numbness.  He is tried home exercises which have only helped a little.  He is tried over-the-counter medications which helped a bit as well.   ROS as above:  Exam:  BP (!) 142/88   Pulse 84   Temp 97.9 F (36.6 C) (Oral)   Ht 6\' 2"  (1.88 m)   Wt 241 lb (109.3 kg)   BMI 30.94 kg/m  Gen: Well NAD HEENT: EOMI,  MMM posterior pharynx with cobblestoning.  Clear nasal discharge with inflamed nasal turbinates.  Nontender maxillary sinuses.  Normal tympanic membranes bilaterally. Lungs: Normal work of breathing. CTABL Heart: RRR no MRG Abd: NABS, Soft. Nondistended, Nontender Exts: Brisk capillary refill, warm and well perfused.  C-spine: Nontender to spinal midline.  Tender to palpation bilateral inferior cervical paraspinal muscle group. Cervical motion is intact. Upper extremity strength and coordination is intact.  Lab and Radiology Results Results for orders placed or performed in visit on 09/09/17 (from the past 72  hour(s))  POCT rapid strep A     Status: None   Collection Time: 09/09/17  3:23 PM  Result Value Ref Range   Rapid Strep A Screen Negative Negative   Cervical spine x-ray personal independent review of images Loss of normal cervical lordosis.  Degenerative changes at C5-6 and C6-7 present.  No fracture or acute findings present.  Awaiting formal radiology review  Assessment and Plan: 52 y.o. male with  Viral URI likely.  Plan for symptomatic management watchful waiting.  Patient is leaving out of the country in several days.  I prescribed back-up antibiotic for patient to take with him.  He will take the amoxicillin if he worsens while away.  Neck pain: Subacute now for 4 months.  Sure shows degenerative changes.  Plan for trial of cyclobenzaprine and referral to physical therapy.  Recheck in about 7 weeks.  Return sooner if needed.   Orders Placed This Encounter  Procedures  . DG Cervical Spine Complete    Standing Status:   Future    Number of Occurrences:   1    Standing Expiration Date:   11/10/2018    Order Specific Question:   Reason for Exam (SYMPTOM  OR DIAGNOSIS REQUIRED)    Answer:   eval pain cspine x 4 months    Order Specific Question:   Preferred imaging location?    Answer:   [facility]    Order Specific Question:   Radiology Contrast Protocol - do NOT remove file path  Answer:   [email or web address]  . Ambulatory referral to Physical Therapy    Referral Priority:   Routine    Referral Type:   Physical Medicine    Referral Reason:   Specialty Services Required    Requested Specialty:   Physical Therapy  . POCT rapid strep A   Meds ordered this encounter  Medications  . cyclobenzaprine (FLEXERIL) 10 MG tablet    Sig: Take 1 tablet (10 mg total) by mouth 3 times/day as needed-between meals & bedtime for muscle spasms (muscle spasms).    Dispense:  30 tablet    Refill:  0  . amoxicillin (AMOXIL) 500 MG capsule     Sig: Take 1 capsule (500 mg total) by mouth 3 (three) times daily. Take if worse on vacation for possible strep    Dispense:  21 capsule    Refill:  0     Historical information moved to improve visibility of documentation.  Past Medical History:  Diagnosis Date  . Hyperlipidemia   . Thyroid disease    Past Surgical History:  Procedure Laterality Date  . APPENDECTOMY     Social History   Tobacco Use  . Smoking status: Never Smoker  . Smokeless tobacco: Never Used  Substance Use Topics  . Alcohol use: No   family history is not on file.  Medications: Current Outpatient Medications  Medication Sig Dispense Refill  . levothyroxine (SYNTHROID, LEVOTHROID) 175 MCG tablet TAKE 1 TABLET BY MOUTH BEFORE BREAKFAST 90 tablet 0  . rosuvastatin (CRESTOR) 10 MG tablet Take 1 tablet (10 mg total) by mouth daily. 90 tablet 3  . amoxicillin (AMOXIL) 500 MG capsule Take 1 capsule (500 mg total) by mouth 3 (three) times daily. Take if worse on vacation for possible strep 21 capsule 0  . cyclobenzaprine (FLEXERIL) 10 MG tablet Take 1 tablet (10 mg total) by mouth 3 times/day as needed-between meals & bedtime for muscle spasms (muscle spasms). 30 tablet 0   No current facility-administered medications for this visit.    Allergies  Allergen Reactions  . Atorvastatin     Other reaction(s): Other Muscle aches and fatigue  . Pravastatin     Other reaction(s): Other headaches    Health Maintenance Health Maintenance  Topic Date Due  . INFLUENZA VACCINE  11/12/2017  . TETANUS/TDAP  07/18/2027  . HIV Screening  Completed    Discussed warning signs or symptoms. Please see discharge instructions. Patient expresses understanding.

## 2017-09-09 NOTE — Patient Instructions (Addendum)
Thank you for coming in today. Call or go to the emergency room if you get worse, have trouble breathing, have chest pains, or palpitations.  Get xray now.  Schedule PT for when you get back.   Recheck with me in 7 weeks.

## 2017-12-07 ENCOUNTER — Other Ambulatory Visit: Payer: Self-pay | Admitting: Family Medicine

## 2018-02-12 LAB — HM COLONOSCOPY

## 2018-05-14 ENCOUNTER — Other Ambulatory Visit: Payer: Self-pay | Admitting: Family Medicine

## 2018-05-18 ENCOUNTER — Other Ambulatory Visit: Payer: Self-pay

## 2018-05-18 DIAGNOSIS — E039 Hypothyroidism, unspecified: Secondary | ICD-10-CM

## 2018-05-18 DIAGNOSIS — E785 Hyperlipidemia, unspecified: Secondary | ICD-10-CM

## 2018-05-18 DIAGNOSIS — R7309 Other abnormal glucose: Secondary | ICD-10-CM

## 2018-05-18 MED ORDER — LEVOTHYROXINE SODIUM 175 MCG PO TABS: 175.0000 ug | ORAL_TABLET | Freq: Every day | ORAL | 0 refills | Status: DC

## 2018-05-18 NOTE — Telephone Encounter (Signed)
Natanel advised to schedule a follow up appointment.

## 2018-05-19 LAB — TSH: TSH: 0.09 mIU/L — ABNORMAL LOW (ref 0.40–4.50)

## 2018-05-19 LAB — COMPLETE METABOLIC PANEL WITH GFR
AG RATIO: 2 (calc) (ref 1.0–2.5)
ALT: 25 U/L (ref 9–46)
AST: 19 U/L (ref 10–35)
Albumin: 4.5 g/dL (ref 3.6–5.1)
Alkaline phosphatase (APISO): 72 U/L (ref 35–144)
BUN: 12 mg/dL (ref 7–25)
CO2: 25 mmol/L (ref 20–32)
Calcium: 9.6 mg/dL (ref 8.6–10.3)
Chloride: 102 mmol/L (ref 98–110)
Creat: 0.94 mg/dL (ref 0.70–1.33)
GFR, EST AFRICAN AMERICAN: 109 mL/min/{1.73_m2} (ref >=60)
GFR, Est Non African American: 94 mL/min/{1.73_m2} (ref >=60)
Globulin: 2.3 g/dL (calc) (ref 1.9–3.7)
Glucose, Bld: 83 mg/dL (ref 65–99)
Potassium: 4.2 mmol/L (ref 3.5–5.3)
Sodium: 136 mmol/L (ref 135–146)
TOTAL PROTEIN: 6.8 g/dL (ref 6.1–8.1)
Total Bilirubin: 0.9 mg/dL (ref 0.2–1.2)

## 2018-05-19 LAB — CBC WITH DIFFERENTIAL/PLATELET
ABSOLUTE MONOCYTES: 633 {cells}/uL (ref 200–950)
Basophils Absolute: 29 cells/uL (ref 0–200)
Basophils Relative: 0.5 %
EOS ABS: 143 {cells}/uL (ref 15–500)
Eosinophils Relative: 2.5 %
HCT: 43.3 % (ref 38.5–50.0)
Hemoglobin: 14.6 g/dL (ref 13.2–17.1)
Lymphs Abs: 3249 cells/uL (ref 850–3900)
MCH: 28.7 pg (ref 27.0–33.0)
MCHC: 33.7 g/dL (ref 32.0–36.0)
MCV: 85.1 fL (ref 80.0–100.0)
MPV: 10.2 fL (ref 7.5–12.5)
Monocytes Relative: 11.1 %
Neutro Abs: 1647 cells/uL (ref 1500–7800)
Neutrophils Relative %: 28.9 %
Platelets: 281 10*3/uL (ref 140–400)
RBC: 5.09 10*6/uL (ref 4.20–5.80)
RDW: 12 % (ref 11.0–15.0)
Total Lymphocyte: 57 %
WBC: 5.7 10*3/uL (ref 3.8–10.8)

## 2018-05-19 LAB — LIPID PANEL W/REFLEX DIRECT LDL
Cholesterol: 241 mg/dL — ABNORMAL HIGH (ref <200)
HDL: 41 mg/dL (ref >=40)
LDL Cholesterol (Calc): 170 mg/dL (calc) — ABNORMAL HIGH
Non-HDL Cholesterol (Calc): 200 mg/dL (calc) — ABNORMAL HIGH (ref <130)
Total CHOL/HDL Ratio: 5.9 (calc) — ABNORMAL HIGH (ref <5.0)
Triglycerides: 154 mg/dL — ABNORMAL HIGH (ref <150)

## 2018-05-19 LAB — HEMOGLOBIN A1C
HEMOGLOBIN A1C: 5.6 %{Hb} (ref <5.7)
Mean Plasma Glucose: 114 (calc)
eAG (mmol/L): 6.3 (calc)

## 2018-05-20 ENCOUNTER — Other Ambulatory Visit: Payer: Self-pay | Admitting: *Deleted

## 2018-05-20 DIAGNOSIS — E039 Hypothyroidism, unspecified: Secondary | ICD-10-CM

## 2018-07-14 ENCOUNTER — Other Ambulatory Visit: Payer: Self-pay | Admitting: Family Medicine

## 2018-07-14 ENCOUNTER — Telehealth: Payer: Self-pay | Admitting: *Deleted

## 2018-07-14 NOTE — Telephone Encounter (Signed)
lvm asking pt to rtn call so that we can refill his medication and get labs done. Asking that he schedule a virtual visit .Marland KitchenElouise Munroe, [place]

## 2018-07-15 ENCOUNTER — Other Ambulatory Visit: Payer: Self-pay

## 2018-07-15 ENCOUNTER — Encounter: Payer: Self-pay | Admitting: Family Medicine

## 2018-07-15 ENCOUNTER — Ambulatory Visit (INDEPENDENT_AMBULATORY_CARE_PROVIDER_SITE_OTHER): Payer: 59 | Admitting: Family Medicine

## 2018-07-15 VITALS — BP 130/89 | HR 77 | Ht 72.0 in | Wt 237.0 lb

## 2018-07-15 DIAGNOSIS — E039 Hypothyroidism, unspecified: Secondary | ICD-10-CM

## 2018-07-15 DIAGNOSIS — E785 Hyperlipidemia, unspecified: Secondary | ICD-10-CM

## 2018-07-15 MED ORDER — ROSUVASTATIN CALCIUM 10 MG PO TABS: 10.0000 mg | ORAL_TABLET | Freq: Every day | ORAL | 3 refills | Status: DC

## 2018-07-15 MED ORDER — LEVOTHYROXINE SODIUM 175 MCG PO TABS: 175.0000 ug | ORAL_TABLET | Freq: Every day | ORAL | 0 refills | Status: DC

## 2018-07-15 NOTE — Progress Notes (Signed)
Virtual Visit via Video Note  I connected with Michael Wong on 07/15/18 at  1:00 PM EDT by a video enabled telemedicine application and verified that I am speaking with the correct person using two identifiers.   I discussed the limitations of evaluation and management by telemedicine and the availability of in person appointments. The patient expressed understanding and agreed to proceed.  Subjective:    CC: check on thyroid.   HPI: Hypothyroidism - Taking medication regularly in the AM away from food and vitamins, etc. No recent change to skin, hair, or energy levels. Still taking 1 whole pill daily.  When we last saw him in February the TSH level was actually too suppressed it was 0.09.  He said that he had actually doubled up on his medication because he had missed some and thought that that would correct it.  We had initially asked him to take a half a tab on Saturdays and a whole the other days a week but he decided to just continue with 1 whole tab daily and try to be more consistent in his dosing.  He is set calendar reminders for himself.  F/Y hyperlipidemia - restarted his statin.   The last time he had labs drawn he had quit taking it for couple of months  Past medical history, Surgical history, Family history not pertinant except as noted below, Social history, Allergies, and medications have been entered into the medical record, reviewed, and corrections made.   Review of Systems: No fevers, chills, night sweats, weight loss, chest pain, or shortness of breath.   Objective:    General: Speaking clearly in complete sentences without any shortness of breath.  Alert and oriented x3.  Normal judgment. No apparent acute distress.   Impression and Recommendations:    Hyperlipidemia -tolerating statin well is not had any side effects or myalgias.  Due to recheck lipids just to make sure that the dose is therapeutic.  I did go ahead and place an order today and encouraged him to  go to the lab once restrictions have been listed lifted.   Hypothyroidism -doing well on current regimen he feels completely asymptomatic.  He said he set a calendar reminder since I last saw him to help make sure that he is taking it consistently.  Plan to recheck level.  He is currently taking 1 whole tab daily.  Again we will plan to check again in 1 to 2 months once restrictions for the pandemic have been lifted.  He can go to the lab at his convenience.    I discussed the assessment and treatment plan with the patient. The patient was provided an opportunity to ask questions and all were answered. The patient agreed with the plan and demonstrated an understanding of the instructions.   The patient was advised to call back or seek an in-person evaluation if the symptoms worsen or if the condition fails to improve as anticipated.   Beatrice Lecher, MD

## 2018-10-12 ENCOUNTER — Other Ambulatory Visit: Payer: Self-pay | Admitting: Family Medicine

## 2018-10-12 DIAGNOSIS — E039 Hypothyroidism, unspecified: Secondary | ICD-10-CM

## 2018-11-15 ENCOUNTER — Other Ambulatory Visit: Payer: Self-pay

## 2018-11-15 ENCOUNTER — Ambulatory Visit (INDEPENDENT_AMBULATORY_CARE_PROVIDER_SITE_OTHER): Payer: BLUE CROSS/BLUE SHIELD

## 2018-11-15 ENCOUNTER — Encounter: Payer: Self-pay | Admitting: Physician Assistant

## 2018-11-15 ENCOUNTER — Ambulatory Visit (INDEPENDENT_AMBULATORY_CARE_PROVIDER_SITE_OTHER): Payer: BC Managed Care – PPO | Admitting: Physician Assistant

## 2018-11-15 VITALS — BP 133/82 | HR 67 | Ht 72.0 in | Wt 254.3 lb

## 2018-11-15 DIAGNOSIS — S99921A Unspecified injury of right foot, initial encounter: Secondary | ICD-10-CM | POA: Diagnosis not present

## 2018-11-15 DIAGNOSIS — M79674 Pain in right toe(s): Secondary | ICD-10-CM | POA: Diagnosis not present

## 2018-11-15 DIAGNOSIS — M2550 Pain in unspecified joint: Secondary | ICD-10-CM

## 2018-11-15 DIAGNOSIS — E6609 Other obesity due to excess calories: Secondary | ICD-10-CM

## 2018-11-15 DIAGNOSIS — Z131 Encounter for screening for diabetes mellitus: Secondary | ICD-10-CM

## 2018-11-15 DIAGNOSIS — Z6834 Body mass index (BMI) 34.0-34.9, adult: Secondary | ICD-10-CM

## 2018-11-15 DIAGNOSIS — R4589 Other symptoms and signs involving emotional state: Secondary | ICD-10-CM | POA: Insufficient documentation

## 2018-11-15 DIAGNOSIS — F329 Major depressive disorder, single episode, unspecified: Secondary | ICD-10-CM

## 2018-11-15 DIAGNOSIS — E039 Hypothyroidism, unspecified: Secondary | ICD-10-CM

## 2018-11-15 DIAGNOSIS — E782 Mixed hyperlipidemia: Secondary | ICD-10-CM | POA: Diagnosis not present

## 2018-11-15 DIAGNOSIS — M791 Myalgia, unspecified site: Secondary | ICD-10-CM | POA: Insufficient documentation

## 2018-11-15 DIAGNOSIS — M19071 Primary osteoarthritis, right ankle and foot: Secondary | ICD-10-CM

## 2018-11-15 MED ORDER — PHENTERMINE HCL 37.5 MG PO TABS: 37.5000 mg | ORAL_TABLET | Freq: Every day | ORAL | 0 refills | Status: DC

## 2018-11-15 NOTE — Progress Notes (Signed)
Subjective:    Patient ID: Michael Wong, male    DOB: 09/02/1965, 53 y.o.   MRN: [id number]  HPI  Pt is a 53 yo male with pmhx of hypothyroidism who presents to the clinic to discuss right great toe pain. Per patient if he is honest he has had pain in his toe for a few years but over the past 6 weeks as worsened. Pain is made worse by walking or running. He is not aware of any injury to the toe although he has what appears to be a bruise over the MTP joint. He is not taking anything for pain. He is frustrated because he feels like this nagging pain is keeping him from getting healthy. He feels tired all the time. He has muscle and joint pain. He admits to eating terribly. He admits to being very stressed being at home so much during quarantine. He really wants to lose weight.   BP elevated in office today but checks at home and 120's over 80's per patient. No CP, palpitations, chest tightness.   .. Active Ambulatory Problems    Diagnosis Date Noted  . Hypothyroidism 12/20/2008  . Hyperlipidemia 12/20/2008  . FATIGUE 11/21/2009  . BENIGN NEOPLASM OF OTHER SPECIFIED SITES OF SKIN 06/13/2010  . Hyperbilirubinemia 03/03/2016  . Class 1 obesity due to excess calories with serious comorbidity and body mass index (BMI) of 34.0 to 34.9 in adult 11/15/2018  . Myalgia 11/15/2018  . Depressed mood 11/15/2018  . Great toe pain, right 11/15/2018  . Arthralgia 11/15/2018  . Arthritis of first metatarsophalangeal (MTP) joint of right foot 11/17/2018   Resolved Ambulatory Problems    Diagnosis Date Noted  . SHOULDER PAIN 11/21/2009   Past Medical History:  Diagnosis Date  . Thyroid disease       Review of Systems    see HPI>  Objective:   Physical Exam Vitals signs reviewed.  Constitutional:      Appearance: Normal appearance. He is obese.  HENT:     Head: Normocephalic.  Cardiovascular:     Rate and Rhythm: Normal rate and regular rhythm.     Pulses: Normal pulses.   Pulmonary:     Effort: Pulmonary effort is normal.     Breath sounds: Normal breath sounds.  Musculoskeletal:     Comments: Right foot:  Tenderness over first MTP with penny size bruise.  Tenderness with passive and active ROM of right great toe.  No swelling/redness/warmth.  NROM of foot.  No pain over plantar foot to palpation.  No pain with plantar/dorsi flexion.   Neurological:     General: No focal deficit present.     Mental Status: He is alert and oriented to person, place, and time.  Psychiatric:        Mood and Affect: Mood normal.        Behavior: Behavior normal.           Assessment & Plan:  Marland KitchenMarland KitchenCorwyn was seen today for toe pain.  Diagnoses and all orders for this visit:  Great toe pain, right -     DG Foot Complete Right -     Uric acid -     meloxicam (MOBIC) 15 MG tablet; Take 1 tablet (15 mg total) by mouth daily.  Mixed hyperlipidemia -     Lipid Panel w/reflex Direct LDL  Acquired hypothyroidism -     TSH  Screening for diabetes mellitus -     COMPLETE METABOLIC PANEL WITH GFR  Arthralgia, unspecified joint -     CK  Class 1 obesity due to excess calories with serious comorbidity and body mass index (BMI) of 34.0 to 34.9 in adult -     phentermine (ADIPEX-P) 37.5 MG tablet; Take 1 tablet (37.5 mg total) by mouth daily before breakfast.  Myalgia  Depressed mood  Arthritis of first metatarsophalangeal (MTP) joint of right foot -     meloxicam (MOBIC) 15 MG tablet; Take 1 tablet (15 mg total) by mouth daily.  Hypothyroidism, unspecified type -     levothyroxine (EUTHYROX) 175 MCG tablet; TAKE 1 TABLET BY MOUTH ONCE DAILY BEFORE  BREAKFAST   Xray showed: 1. Possible subacute/healing nondisplaced fracture at the tuft of the first distal phalanx 2. Arthritis at the first MTP joint  I certainly think the arthritis is likely causing most of his pain and discomfort. Likely subactue fracture is contributing to the worsening pain.  Add mobic  for arthritis.  Make sure wearing good supportive shoe.  Suggested to make visit with sports medicine for potential injection.   Marland Kitchen.Discussed low carb diet with 1500 calories and 80g of protein.  Exercising at least 150 minutes a week.  [employer] could be a Microbiologist.  Discussed medication options.  Rechecked BP had improved.  Start phentermine. Encouraged to cut in half.  Follow up with P[facility] in one month.  Discussed IF 16:8 to help reduce body inflammation and lose weight.   Cannot completely exclude crestor from causing myalgias. Will get CK level.   Will recheck TSH.   Discussed mood. I do think if we get his toe feeling better and start living a healthier lifestyle will improve. Encouraged exercise/meditation. May need to consider counseling.   Marland Kitchen.Spent 40 minutes with patient and greater than 50 percent of visit spent counseling patient regarding treatment plan.

## 2018-11-15 NOTE — Patient Instructions (Signed)
Start with 1/2 tablet daily.  IF 16:8  Follow up in one month.   Phentermine tablets or capsules What is this medicine? PHENTERMINE (FEN ter meen) decreases your appetite. It is used with a reduced calorie diet and exercise to help you lose weight. This medicine may be used for other purposes; ask your health care provider or pharmacist if you have questions. COMMON BRAND NAME(S): Adipex-P, Atti-Plex P, Atti-Plex P Spansule, Fastin, Lomaira, Pro-Fast, Tara-8 What should I tell my health care provider before I take this medicine? They need to know if you have any of these conditions:  agitation or nervousness  diabetes  glaucoma  heart disease  high blood pressure  history of drug abuse or addiction  history of stroke  kidney disease  lung disease called Primary Pulmonary Hypertension (PPH)  taken an MAOI like Carbex, Eldepryl, Marplan, Nardil, or Parnate in last 14 days  taking stimulant medicines for attention disorders, weight loss, or to stay awake  thyroid disease  an unusual or allergic reaction to phentermine, other medicines, foods, dyes, or preservatives  pregnant or trying to get pregnant  breast-feeding How should I use this medicine? Take this medicine by mouth with a glass of water. Follow the directions on the prescription label. The instructions for use may differ based on the product and dose you are taking. Avoid taking this medicine in the evening. It may interfere with sleep. Take your doses at regular intervals. Do not take your medicine more often than directed. Talk to your pediatrician regarding the use of this medicine in children. While this drug may be prescribed for children 17 years or older for selected conditions, precautions do apply. Overdosage: If you think you have taken too much of this medicine contact a poison control center or emergency room at once. NOTE: This medicine is only for you. Do not share this medicine with others. What if  I miss a dose? If you miss a dose, take it as soon as you can. If it is almost time for your next dose, take only that dose. Do not take double or extra doses. What may interact with this medicine? Do not take this medicine with any of the following medications:  MAOIs like Carbex, Eldepryl, Marplan, Nardil, and Parnate  medicines for colds or breathing difficulties like pseudoephedrine or phenylephrine  procarbazine  sibutramine  stimulant medicines for attention disorders, weight loss, or to stay awake This medicine may also interact with the following medications:  certain medicines for depression, anxiety, or psychotic disturbances  linezolid  medicines for diabetes  medicines for high blood pressure This list may not describe all possible interactions. Give your health care provider a list of all the medicines, herbs, non-prescription drugs, or dietary supplements you use. Also tell them if you smoke, drink alcohol, or use illegal drugs. Some items may interact with your medicine. What should I watch for while using this medicine? Notify your physician immediately if you become short of breath while doing your normal activities. Do not take this medicine within 6 hours of bedtime. It can keep you from getting to sleep. Avoid drinks that contain caffeine and try to stick to a regular bedtime every night. This medicine was intended to be used in addition to a healthy diet and exercise. The best results are achieved this way. This medicine is only indicated for short-term use. Eventually your weight loss may level out. At that point, the drug will only help you maintain your new weight. Do  not increase or in any way change your dose without consulting your doctor. You may get drowsy or dizzy. Do not drive, use machinery, or do anything that needs mental alertness until you know how this medicine affects you. Do not stand or sit up quickly, especially if you are an older patient. This  reduces the risk of dizzy or fainting spells. Alcohol may increase dizziness and drowsiness. Avoid alcoholic drinks. What side effects may I notice from receiving this medicine? Side effects that you should report to your doctor or health care professional as soon as possible:  allergic reactions like skin rash, itching or hives, swelling of the face, lips, or tongue)  anxiety  breathing problems  changes in vision  chest pain or chest tightness  depressed mood or other mood changes  hallucinations, loss of contact with reality  fast, irregular heartbeat  increased blood pressure  irritable  nervousness or restlessness  painful urination  palpitations  tremors  trouble sleeping  seizures  signs and symptoms of a stroke like changes in vision; confusion; trouble speaking or understanding; severe headaches; sudden numbness or weakness of the face, arm or leg; trouble walking; dizziness; loss of balance or coordination  unusually weak or tired  vomiting Side effects that usually do not require medical attention (report to your doctor or health care professional if they continue or are bothersome):  constipation or diarrhea  dry mouth  headache  nausea  stomach upset  sweating This list may not describe all possible side effects. Call your doctor for medical advice about side effects. You may report side effects to FDA at 1-800-FDA-1088. Where should I keep my medicine? Keep out of the reach of children. This medicine can be abused. Keep your medicine in a safe place to protect it from theft. Do not share this medicine with anyone. Selling or giving away this medicine is dangerous and against the law. This medicine may cause accidental overdose and death if taken by other adults, children, or pets. Mix any unused medicine with a substance like cat litter or coffee grounds. Then throw the medicine away in a sealed container like a sealed bag or a coffee can with a  lid. Do not use the medicine after the expiration date. Store at room temperature between 20 and 25 degrees C (68 and 77 degrees F). Keep container tightly closed. NOTE: This sheet is a summary. It may not cover all possible information. If you have questions about this medicine, talk to your doctor, pharmacist, or health care provider.  2020 Elsevier/Gold Standard (2016-09-12 08:23:13)

## 2018-11-16 LAB — COMPLETE METABOLIC PANEL WITH GFR
AG Ratio: 1.8 (calc) (ref 1.0–2.5)
ALT: 37 U/L (ref 9–46)
AST: 30 U/L (ref 10–35)
Albumin: 4.6 g/dL (ref 3.6–5.1)
Alkaline phosphatase (APISO): 68 U/L (ref 35–144)
BUN: 19 mg/dL (ref 7–25)
CO2: 26 mmol/L (ref 20–32)
Calcium: 9.4 mg/dL (ref 8.6–10.3)
Chloride: 104 mmol/L (ref 98–110)
Creat: 0.96 mg/dL (ref 0.70–1.33)
GFR, Est African American: 106 mL/min/{1.73_m2} (ref >=60)
GFR, Est Non African American: 92 mL/min/{1.73_m2} (ref >=60)
Globulin: 2.5 g/dL (calc) (ref 1.9–3.7)
Glucose, Bld: 86 mg/dL (ref 65–139)
Potassium: 4.4 mmol/L (ref 3.5–5.3)
Sodium: 139 mmol/L (ref 135–146)
Total Bilirubin: 1.4 mg/dL — ABNORMAL HIGH (ref 0.2–1.2)
Total Protein: 7.1 g/dL (ref 6.1–8.1)

## 2018-11-16 LAB — LIPID PANEL W/REFLEX DIRECT LDL
Cholesterol: 177 mg/dL (ref <200)
HDL: 38 mg/dL — ABNORMAL LOW (ref >=40)
LDL Cholesterol (Calc): 102 mg/dL (calc) — ABNORMAL HIGH
Non-HDL Cholesterol (Calc): 139 mg/dL (calc) — ABNORMAL HIGH (ref <130)
Total CHOL/HDL Ratio: 4.7 (calc) (ref <5.0)
Triglycerides: 261 mg/dL — ABNORMAL HIGH (ref <150)

## 2018-11-16 LAB — URIC ACID: Uric Acid, Serum: 6.9 mg/dL (ref 4.0–8.0)

## 2018-11-16 LAB — CK: Total CK: 314 U/L — ABNORMAL HIGH (ref 44–196)

## 2018-11-16 LAB — TSH: TSH: 3.42 mIU/L (ref 0.40–4.50)

## 2018-11-16 NOTE — Progress Notes (Signed)
Uric acid normal range.   CK level is a little elevated could be due to the statin. Yes cholesterol is great.  TG are elevated!  Thyroid is normal range but up a lot from 2 years ago.   Kidney and liver looks good. Bilirubin back up some.   Take crestor every other day and see if cuts back on achiness.

## 2018-11-16 NOTE — Progress Notes (Signed)
Xray showed possible healing fracture just under the toenail and a lot of arthritis over where your bruise is. My suggestion is put you in post op shoe for a few weeks since it continues to be painful and follow up with sports medicine to consider injection where the arthritis is.

## 2018-11-17 DIAGNOSIS — M19071 Primary osteoarthritis, right ankle and foot: Secondary | ICD-10-CM | POA: Insufficient documentation

## 2018-11-17 MED ORDER — MELOXICAM 15 MG PO TABS: 15.0000 mg | ORAL_TABLET | Freq: Every day | ORAL | 1 refills | Status: DC

## 2018-11-17 MED ORDER — LEVOTHYROXINE SODIUM 175 MCG PO TABS: ORAL_TABLET | ORAL | 3 refills | Status: DC

## 2018-12-22 ENCOUNTER — Telehealth: Payer: Self-pay

## 2018-12-22 NOTE — Telephone Encounter (Signed)
Insurance doesn't usually cover it that way. They will usually cover in 90 day suppluy with refills.  It looks like script sent last mont was for 90 with refills.

## 2018-12-22 NOTE — Telephone Encounter (Signed)
That is fine 

## 2018-12-22 NOTE — Telephone Encounter (Signed)
Called patient, he states he gets this RX from [facility] and its $4. RX was already sent for #90 days with 3 RF.Marland Kitchen OK to just call [facility] and give OK to make this just a 365 day supply?

## 2018-12-22 NOTE — Telephone Encounter (Signed)
Michael Wong called and states he would like a prescription of the Levothyroxine to be written for 360 tablets. He wants a years worth just in case something happens. Patient is aware Dr Madilyn Fireman will be out of the office until Monday.

## 2018-12-23 NOTE — Telephone Encounter (Signed)
Called and spoke with Michael Wong at [place], [place] changed to #365 with 0 RF

## 2019-05-04 ENCOUNTER — Telehealth (INDEPENDENT_AMBULATORY_CARE_PROVIDER_SITE_OTHER): Payer: BC Managed Care – PPO | Admitting: Family Medicine

## 2019-05-04 ENCOUNTER — Other Ambulatory Visit: Payer: Self-pay

## 2019-05-04 ENCOUNTER — Encounter: Payer: Self-pay | Admitting: Family Medicine

## 2019-05-04 VITALS — BP 150/112 | HR 99 | Ht 72.0 in | Wt 254.0 lb

## 2019-05-04 DIAGNOSIS — M542 Cervicalgia: Secondary | ICD-10-CM

## 2019-05-04 DIAGNOSIS — R030 Elevated blood-pressure reading, without diagnosis of hypertension: Secondary | ICD-10-CM

## 2019-05-04 DIAGNOSIS — G8929 Other chronic pain: Secondary | ICD-10-CM

## 2019-05-04 DIAGNOSIS — Z3009 Encounter for other general counseling and advice on contraception: Secondary | ICD-10-CM | POA: Diagnosis not present

## 2019-05-04 DIAGNOSIS — R519 Headache, unspecified: Secondary | ICD-10-CM

## 2019-05-04 MED ORDER — BUTALBITAL-APAP-CAFFEINE 50-325-40 MG PO TABS: 1.0000 | ORAL_TABLET | Freq: Four times a day (QID) | ORAL | 5 refills | Status: DC | PRN

## 2019-05-04 NOTE — Progress Notes (Addendum)
Virtual Visit via Video Note  I connected with Michael Wong on 05/04/19 at 10:10 AM EST by a video enabled telemedicine application and verified that I am speaking with the correct person using two identifiers. C   I discussed the limitations of evaluation and management by telemedicine and the availability of in person appointments. The patient expressed understanding and agreed to proceed.  Subjective:    CC: headaches.    HPI:  54 yo male c/o of more frequent headaches.  He says is not unusual for him to get a headache but they have just been a little bit more frequent and they are lasting longer than they used to.  Has some neck pain and tries to do some stretches nad uses a ball for pressure points.  Muscle relaxer make him feel bad. Occ gest shart shooting pain in his neck. Has been really stressed. She tried taking Fiorecet that belonged to his wife and says that it actually was really helpful.  Most of the time he relies on goodies for pain relief but lately it has not been working as well.    He says he notices a direct correlation between his pain and his blood pressure shooting up.  He says when he gets a lot of plane his blood pressure has gone as high as 180 on the top number.  He notes physical therapy would be helpful but really just cannot afford it right now.  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  His wife is also had some health problems so he has had to do a lot of the caretaking for his children as well.  He is also interested in referral for vasectomy.   Past medical history, Surgical history, Family history not pertinant except as noted below, Social history, Allergies, and medications have been entered into the medical record, reviewed, and corrections made.   Review of  Systems: No fevers, chills, night sweats, weight loss, chest pain, or shortness of breath.   Objective:    General: Speaking clearly in complete sentences without any shortness of breath.  Alert and oriented x3.  Normal judgment. No apparent acute distress.    Impression and Recommendations:   Headaches-discussed treatment options.  Since Fioricet did work well I will go ahead and send over prescription to the pharmacy.  I did remind him that it does have caffeine in it and so to be careful about not overusing the medication.  We also discussed that it can cause a rebound phenomenon with any headache medication even if it is over-the-counter such as the [facility] powder or the Fioricet if he is using them too frequently.  Also encouraged to work on stress reduction.  Neck pain-continue to work on home stretches.  If at some point he is able to fit physical therapy into his budget I think it could be incredibly helpful for him.  He might even benefit from trigger point injections  Or dry needling.    Elevated blood pressure certainly could be coming from pain and also from excessive use of over-the-counter pain relievers.   Evaluation for vasectomy-we will refer to urology for further evaluation.  I discussed the assessment and treatment plan with the patient. The patient was provided an opportunity  to ask questions and all were answered. The patient agreed with the plan and demonstrated an understanding of the instructions.   The patient was advised to call back or seek an in-person evaluation if the symptoms worsen or if the condition fails to improve as anticipated.   Time spent 30 min in encounter  Beatrice Lecher, MD

## 2019-05-04 NOTE — Progress Notes (Signed)
Pt reports that the headaches

## 2019-05-19 ENCOUNTER — Telehealth: Payer: Self-pay | Admitting: Family Medicine

## 2019-05-19 DIAGNOSIS — E785 Hyperlipidemia, unspecified: Secondary | ICD-10-CM

## 2019-05-19 DIAGNOSIS — E039 Hypothyroidism, unspecified: Secondary | ICD-10-CM

## 2019-05-19 DIAGNOSIS — E782 Mixed hyperlipidemia: Secondary | ICD-10-CM

## 2019-05-19 NOTE — Telephone Encounter (Signed)
Ordered labs based on Hyperlipidemia and Hypothyroidism diagnosis.

## 2019-05-19 NOTE — Telephone Encounter (Signed)
Patient would like labs sent in for Cholesterol and thyroid. States that he is dropping his insurance in June and is wanting to try to get this done. Please advise.

## 2019-05-23 DIAGNOSIS — E039 Hypothyroidism, unspecified: Secondary | ICD-10-CM | POA: Diagnosis not present

## 2019-05-23 DIAGNOSIS — E782 Mixed hyperlipidemia: Secondary | ICD-10-CM | POA: Diagnosis not present

## 2019-05-24 LAB — TSH: TSH: 14.15 mIU/L — ABNORMAL HIGH (ref 0.40–4.50)

## 2019-05-24 LAB — COMPLETE METABOLIC PANEL WITH GFR
AG Ratio: 1.9 (calc) (ref 1.0–2.5)
ALT: 31 U/L (ref 9–46)
AST: 25 U/L (ref 10–35)
Albumin: 4.7 g/dL (ref 3.6–5.1)
Alkaline phosphatase (APISO): 83 U/L (ref 35–144)
BUN: 15 mg/dL (ref 7–25)
CO2: 25 mmol/L (ref 20–32)
Calcium: 9.6 mg/dL (ref 8.6–10.3)
Chloride: 102 mmol/L (ref 98–110)
Creat: 1.03 mg/dL (ref 0.70–1.33)
GFR, Est African American: 97 mL/min/{1.73_m2} (ref >=60)
GFR, Est Non African American: 84 mL/min/{1.73_m2} (ref >=60)
Globulin: 2.5 g/dL (calc) (ref 1.9–3.7)
Glucose, Bld: 89 mg/dL (ref 65–99)
Potassium: 4.4 mmol/L (ref 3.5–5.3)
Sodium: 138 mmol/L (ref 135–146)
Total Bilirubin: 0.5 mg/dL (ref 0.2–1.2)
Total Protein: 7.2 g/dL (ref 6.1–8.1)

## 2019-05-24 LAB — LIPID PANEL W/REFLEX DIRECT LDL
Cholesterol: 224 mg/dL — ABNORMAL HIGH (ref <200)
HDL: 42 mg/dL (ref >=40)
LDL Cholesterol (Calc): 151 mg/dL (calc) — ABNORMAL HIGH
Non-HDL Cholesterol (Calc): 182 mg/dL (calc) — ABNORMAL HIGH (ref <130)
Total CHOL/HDL Ratio: 5.3 (calc) — ABNORMAL HIGH (ref <5.0)
Triglycerides: 179 mg/dL — ABNORMAL HIGH (ref <150)

## 2019-05-25 MED ORDER — LEVOTHYROXINE SODIUM 200 MCG PO TABS: 200.0000 ug | ORAL_TABLET | Freq: Every day | ORAL | 1 refills | Status: DC

## 2019-05-25 MED ORDER — ROSUVASTATIN CALCIUM 20 MG PO TABS: 20.0000 mg | ORAL_TABLET | Freq: Every day | ORAL | 1 refills | Status: DC

## 2019-05-25 NOTE — Addendum Note (Signed)
Addended by: Beatrice Lecher D on: 05/25/2019 02:58 PM   Modules accepted: Orders

## 2019-06-18 ENCOUNTER — Other Ambulatory Visit: Payer: Self-pay | Admitting: Family Medicine

## 2019-06-20 NOTE — Telephone Encounter (Signed)
I see that he is requesting a refill on the Fioricet.  We just filled it on January 20 for 30 tabs with 5 refills so he actually should have plenty.  Plus just remind him to use it sparingly.  30 tabs really should last him more than a month.  If he is having more frequent headaches than that then we need to discuss other controller medications for his headaches.

## 2019-06-21 MED ORDER — AMITRIPTYLINE HCL 25 MG PO TABS: 25.0000 mg | ORAL_TABLET | Freq: Every day | ORAL | 1 refills | Status: DC

## 2019-06-21 MED ORDER — BUTALBITAL-APAP-CAFFEINE 50-325-40 MG PO TABS: 1.0000 | ORAL_TABLET | Freq: Two times a day (BID) | ORAL | 0 refills | Status: DC | PRN

## 2019-06-21 NOTE — Telephone Encounter (Signed)
Tim advised of recommendations.

## 2019-06-21 NOTE — Telephone Encounter (Signed)
Daryll states he takes the Fioricet 1-2 tablets in the morning and 1 at bedtime. He states he has lost more than half his pay, lost his insurance and is now on food stamps and all 5 of his kids are on Medicaid. He states he is on a payment plan for his light bill. This is all due to COVID-19. He reports he is under a lot of stress. He is having headaches daily and has chronic pain in his shoulder and pain in his neck. He cannot afford an office visit. Please advise.

## 2019-06-21 NOTE — Telephone Encounter (Signed)
OK, please tell him I am so sorry about his situation.  But he is definitely using the Fioricet way too much and that actually can be quite dangerous and it will cause rebound headaches meaning that when the medication wears off and the headache will just come right back.  I did go ahead and refill 30 tabs but I am also sending him over prescription for amitriptyline to take at bedtime.  This will help him get some better control over the daily headaches as well as possibly treat some of his nerve pain.  It should be generic and so should not be too expensive.

## 2019-07-21 ENCOUNTER — Other Ambulatory Visit: Payer: Self-pay | Admitting: Family Medicine

## 2019-07-21 DIAGNOSIS — E039 Hypothyroidism, unspecified: Secondary | ICD-10-CM

## 2019-07-21 NOTE — Telephone Encounter (Signed)
Fiorcet - last filled 06/21/2019 #30 with no refills Last seen 05/04/2019

## 2019-08-22 ENCOUNTER — Other Ambulatory Visit: Payer: Self-pay | Admitting: Family Medicine

## 2019-08-22 DIAGNOSIS — E039 Hypothyroidism, unspecified: Secondary | ICD-10-CM

## 2019-08-26 ENCOUNTER — Other Ambulatory Visit: Payer: Self-pay | Admitting: *Deleted

## 2019-08-26 DIAGNOSIS — E039 Hypothyroidism, unspecified: Secondary | ICD-10-CM

## 2019-09-23 DIAGNOSIS — Z3009 Encounter for other general counseling and advice on contraception: Secondary | ICD-10-CM | POA: Diagnosis not present

## 2019-09-23 DIAGNOSIS — Z302 Encounter for sterilization: Secondary | ICD-10-CM | POA: Diagnosis not present

## 2019-09-25 ENCOUNTER — Other Ambulatory Visit: Payer: Self-pay | Admitting: Family Medicine

## 2019-09-25 DIAGNOSIS — E039 Hypothyroidism, unspecified: Secondary | ICD-10-CM

## 2019-10-04 DIAGNOSIS — E039 Hypothyroidism, unspecified: Secondary | ICD-10-CM | POA: Diagnosis not present

## 2019-10-04 LAB — TSH: TSH: 0.68 mIU/L (ref 0.40–4.50)

## 2019-10-05 ENCOUNTER — Other Ambulatory Visit: Payer: Self-pay | Admitting: Family Medicine

## 2019-10-05 DIAGNOSIS — E039 Hypothyroidism, unspecified: Secondary | ICD-10-CM

## 2019-10-05 MED ORDER — LEVOTHYROXINE SODIUM 200 MCG PO TABS: 200.0000 ug | ORAL_TABLET | Freq: Every day | ORAL | 1 refills | Status: DC

## 2019-10-12 ENCOUNTER — Encounter: Payer: Self-pay | Admitting: Family Medicine

## 2020-01-23 ENCOUNTER — Other Ambulatory Visit: Payer: Self-pay | Admitting: Family Medicine

## 2020-01-23 DIAGNOSIS — E785 Hyperlipidemia, unspecified: Secondary | ICD-10-CM

## 2020-02-03 IMAGING — DX RIGHT FOOT COMPLETE - 3+ VIEW
3 series · 3 of 3 positions shown · non-contrast
Comparison: None.

CLINICAL DATA: Gray toe pain with recent injury

EXAM:
RIGHT FOOT COMPLETE - 3+ VIEW

[foot ap]
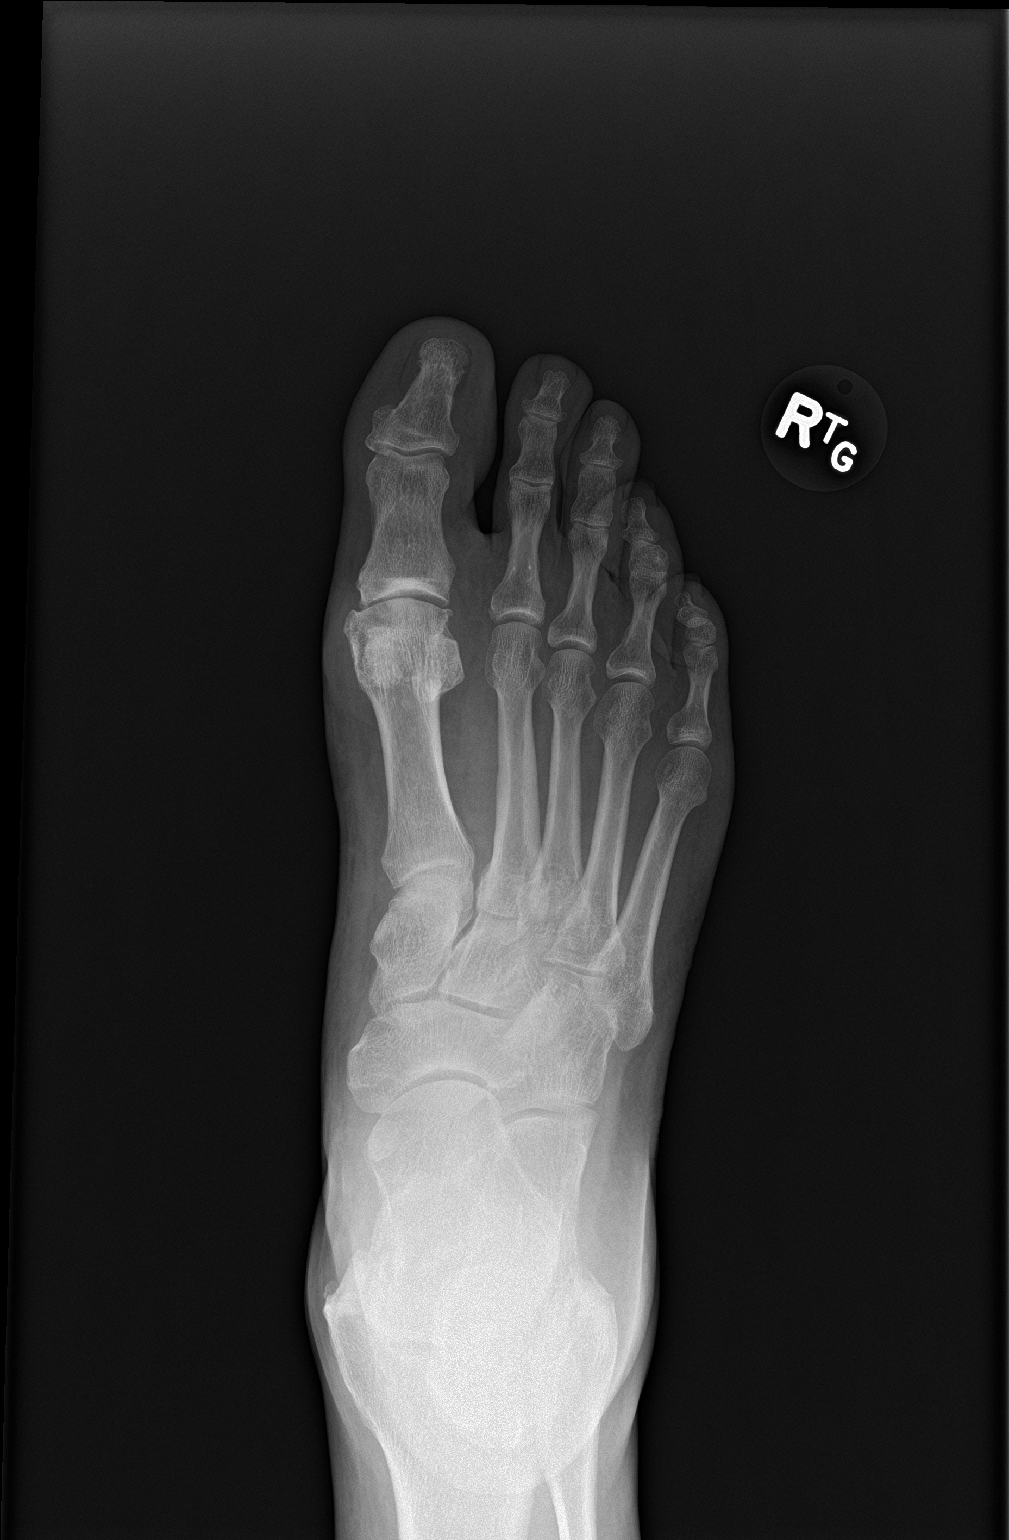

[foot obl]
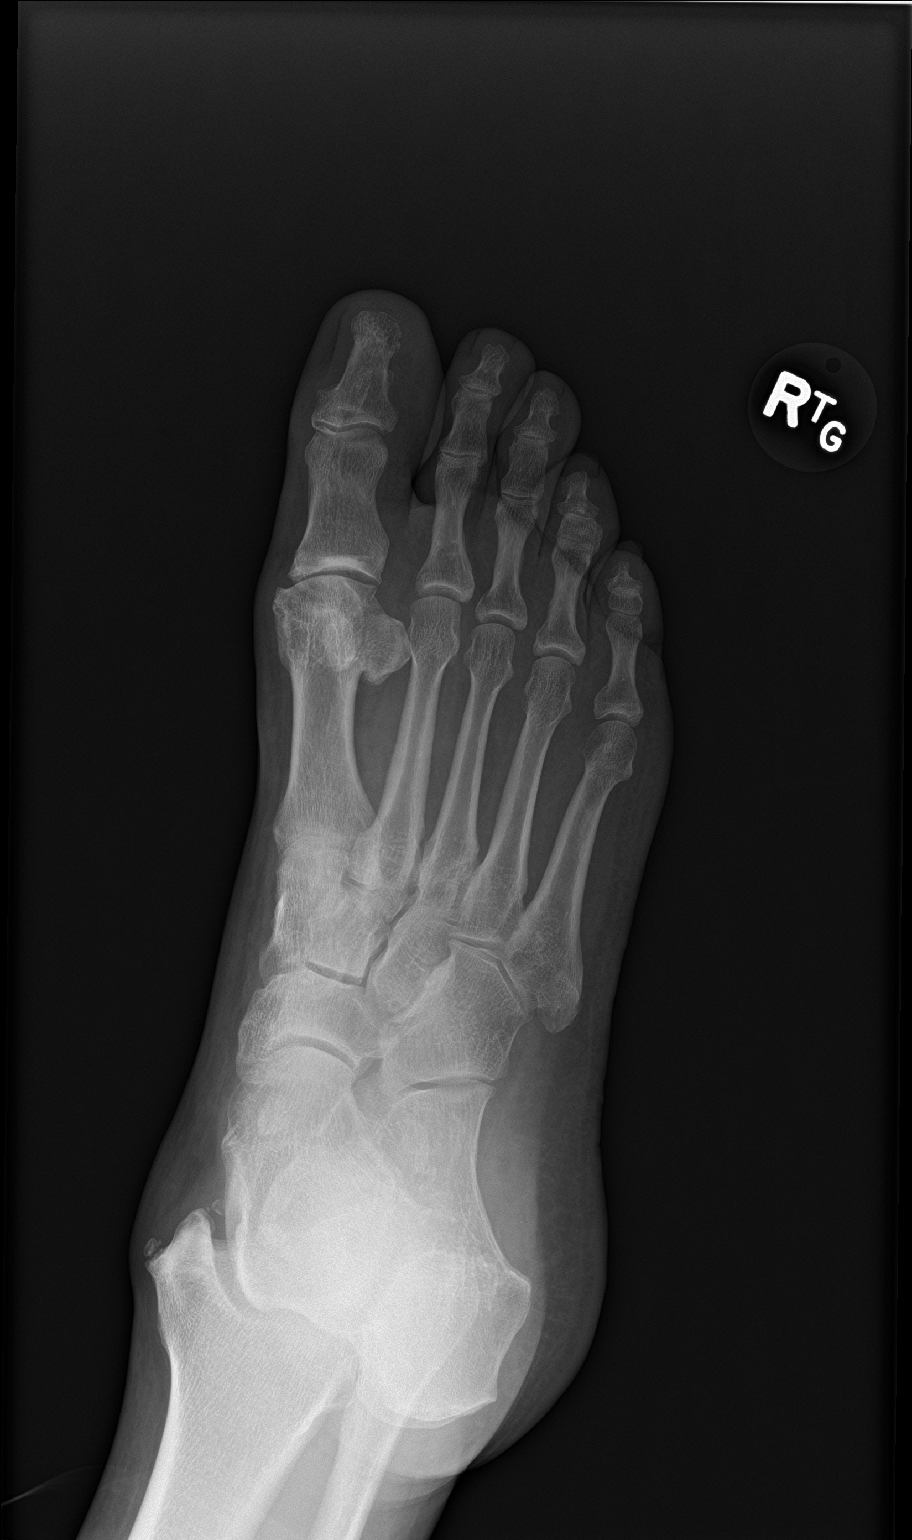

[foot lat]
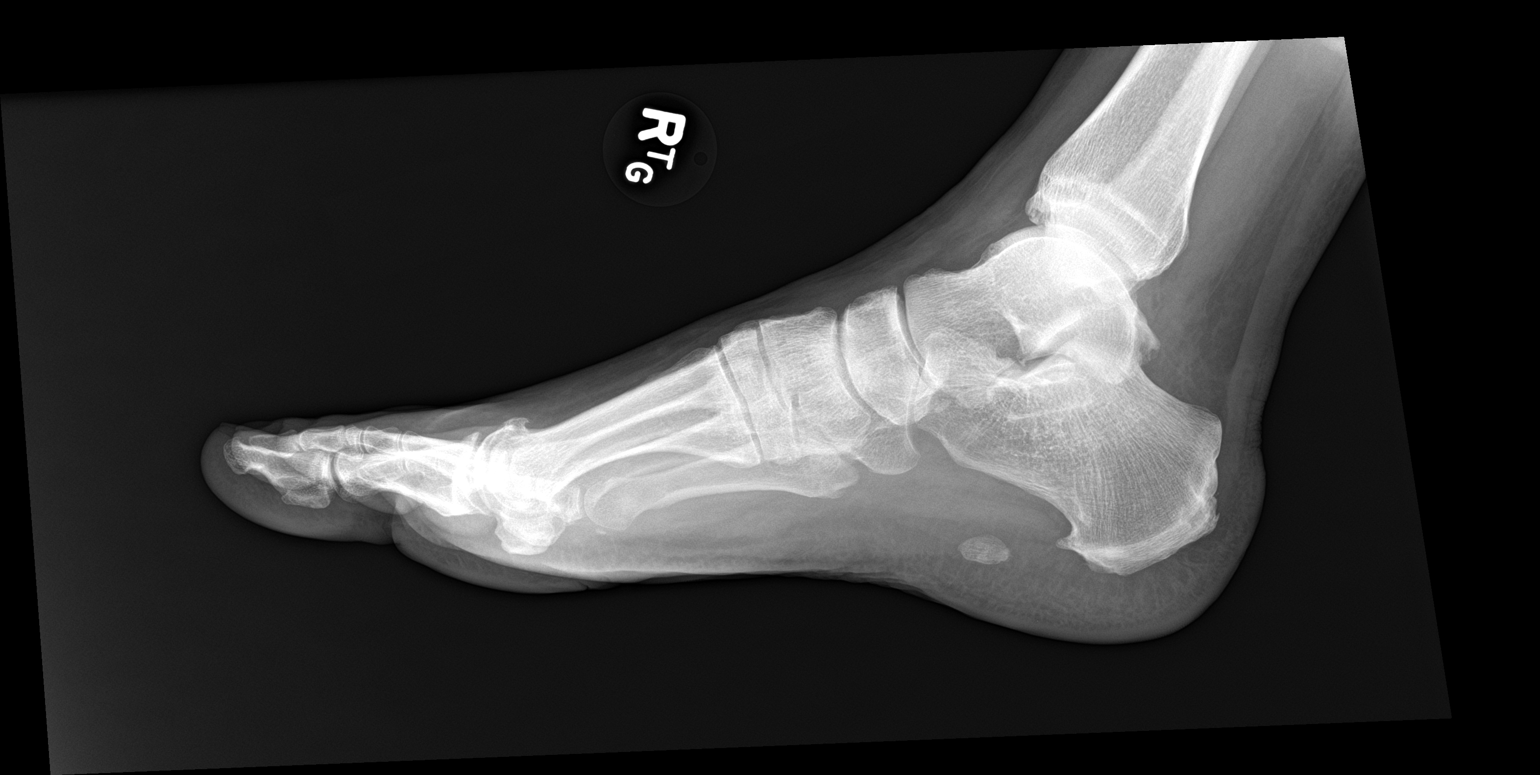

[3 of 3 positions shown; findings below may reference images not displayed]

FINDINGS: Possible subacute/healing nondisplaced fracture at the tuft of the
first distal phalanx. No subluxation. Mild arthritis at the first
MTP joint.
IMPRESSION: 1. Possible subacute/healing nondisplaced fracture at the tuft of
the first distal phalanx
2. Arthritis at the first MTP joint

## 2020-02-06 ENCOUNTER — Other Ambulatory Visit: Payer: Self-pay

## 2020-02-06 ENCOUNTER — Telehealth (INDEPENDENT_AMBULATORY_CARE_PROVIDER_SITE_OTHER): Payer: BC Managed Care – PPO | Admitting: Nurse Practitioner

## 2020-02-06 ENCOUNTER — Encounter: Payer: Self-pay | Admitting: Nurse Practitioner

## 2020-02-06 VITALS — BP 154/100 | HR 104 | Temp 97.4°F

## 2020-02-06 DIAGNOSIS — R030 Elevated blood-pressure reading, without diagnosis of hypertension: Secondary | ICD-10-CM | POA: Diagnosis not present

## 2020-02-06 DIAGNOSIS — R000 Tachycardia, unspecified: Secondary | ICD-10-CM

## 2020-02-06 DIAGNOSIS — H6690 Otitis media, unspecified, unspecified ear: Secondary | ICD-10-CM | POA: Insufficient documentation

## 2020-02-06 DIAGNOSIS — E1159 Type 2 diabetes mellitus with other circulatory complications: Secondary | ICD-10-CM

## 2020-02-06 DIAGNOSIS — I10 Essential (primary) hypertension: Secondary | ICD-10-CM | POA: Insufficient documentation

## 2020-02-06 HISTORY — DX: Type 2 diabetes mellitus with other circulatory complications: E11.59

## 2020-02-06 MED ORDER — AMOXICILLIN-POT CLAVULANATE 875-125 MG PO TABS: 1.0000 | ORAL_TABLET | Freq: Two times a day (BID) | ORAL | 0 refills | Status: DC

## 2020-02-06 NOTE — Patient Instructions (Addendum)
I have called in Augmentin for you to take twice a day for 5 days. If your symptoms have not improved after the first dose at all I do strongly recommend COVID-19 testing as the symptoms are very consistent with the COVID-19 infection and we would like to rule that out as soon as possible to avoid spreading this to others.  You may continue to take ibuprofen and Tylenol as needed for pain and fevers. I recommend Flonase nasal spray 2 sprays in each nostril once a day in addition to over-the-counter Mucinex to help with inflammation and mucus production.  If your symptoms get any worse please let us know immediately.  If you show no improvement by the end of the week please follow-up   I recommend monitoring your blood pressure at home.  If your blood pressure remains elevated after you have completed the antibiotics and the symptoms have resolved please let us know as this will require further follow-up.

## 2020-02-06 NOTE — Progress Notes (Addendum)
Virtual Video Visit via MyChart Note  I connected with  Michael Wong on 02/06/20 at  8:20 AM EDT by the video enabled telemedicine application for , MyChart, and verified that I am speaking with the correct person using two identifiers.   I introduced myself as a Designer, jewellery with the practice. We discussed the limitations of evaluation and management by telemedicine and the availability of in person appointments. The patient expressed understanding and agreed to proceed.  The patient is: at home I am: in the office  Subjective:    CC:  Chief Complaint  Patient presents with  . URI    onset 1 week ago, cough, ST, chest pain when coughing, BA, feverish, chills intermittently, weakness/fatigue, vomiting at onset, diarrhea, chest congestion, green-brown mucus, loss of taste/smell, sinus congestion, runny nose, ear fullness in right ear, has been taking Tylenol and ibuprofen with minimal benefit    HPI: Michael Wong is a 54 y.o. y/o male presenting via [place] today for symptoms of upper respiratory illness that started about a week ago.   He reports that his children went back to preschool and the entire family has been sick with the same symptoms. He reports that his 52 year old and 52 year old have been treated at urgent care and the baby has been seen at their primary care provider. All of them were treated with antibiotics (amoxicillin) and have recovered.   He tells me that no one has been tested for COVID.   He endorses sore throat, R>L ear pain and pressure, low grade intermittent fever and chills, congestion, productive cough, and altered taste and smell. He tells me that at onset it did have one episode of vomiting, but this has resolved. He has also had intermittent diarrhea.   He denies nausea, headache, sinus pain or pressure.  He also denies loss of appetite or inability to eat or drink at this time.  He has been taking Tylenol and ibuprofen with little relief of  his symptoms.  His blood pressure and heart rate are elevated today.  Past medical history, Surgical history, Family history not pertinant except as noted below, Social history, Allergies, and medications have been entered into the medical record, reviewed, and corrections made.   Review of Systems:  See HPI for pertinent positive and negatives  Objective:    General: Speaking clearly in complete sentences without any shortness of breath.   Alert and oriented x3.   Normal judgment.  No apparent acute distress.  Impression and Recommendations:    1. Acute otitis media, unspecified otitis media type Symptoms and presentation consistent with acute otitis media.  Unable to perform physical exam due to limitations with the video visit however given that the entire household has been successfully treated with antibiotic therapy I believe it is reasonable to begin antibiotic therapy today. Did discuss with the patient the option for COVID-19 testing as symptoms are consistent with COVID-19.  He declines this testing today.  Recommend that he seek testing if symptoms show no improvement after the first 2 doses of antibiotic. Recommend Flonase nasal spray and Mucinex to help with additional symptoms.  May continue acetaminophen and ibuprofen alternating as needed for pain and fever. Follow-up if symptoms fail to improve or worsen.  - amoxicillin-clavulanate (AUGMENTIN) 875-125 MG tablet; Take 1 tablet by mouth 2 (two) times daily.  Dispense: 10 tablet; Refill: 0  2. Tachycardia 3. Elevated blood pressure reading Heart rate and blood pressure are elevated today.  He is currently afebrile however I do suspect a mild to moderate component of dehydration given the severity of his symptoms and length of time he has been sick. Recommend following blood pressure at home to ensure that it does not remain elevated.  If blood pressure continues to remain elevated once the infection has resolved please let  us know as this will require further evaluation.    I discussed the assessment and treatment plan with the patient. The patient was provided an opportunity to ask questions and all were answered. The patient agreed with the plan and demonstrated an understanding of the instructions.   The patient was advised to call back or seek an in-person evaluation if the symptoms worsen or if the condition fails to improve as anticipated.  I provided 20 minutes of non-face-to-face interaction with this [place] visit including intake, same-day documentation, and chart review.   Orma Render, NP

## 2020-03-18 ENCOUNTER — Other Ambulatory Visit: Payer: Self-pay | Admitting: Family Medicine

## 2020-03-18 DIAGNOSIS — E039 Hypothyroidism, unspecified: Secondary | ICD-10-CM

## 2020-03-19 NOTE — Telephone Encounter (Signed)
Please call pt and advise him that he is overdue for a f/u for his thyroid and labs.

## 2020-03-21 ENCOUNTER — Telehealth: Payer: Self-pay | Admitting: *Deleted

## 2020-03-21 ENCOUNTER — Other Ambulatory Visit: Payer: Self-pay | Admitting: *Deleted

## 2020-03-21 DIAGNOSIS — E039 Hypothyroidism, unspecified: Secondary | ICD-10-CM

## 2020-03-21 NOTE — Telephone Encounter (Signed)
LVM to get follow up appt scheduled. AM

## 2020-03-21 NOTE — Telephone Encounter (Signed)
Labs ordered to recheck thyroid.

## 2020-03-21 NOTE — Telephone Encounter (Signed)
Pt called today wanting to know if he could have labs ordered without a visit.  I told him that he really needs to come in since he's not been seen IN office since Aug of 2020.  He stated that his ins has changed and all medical visits go toward a deductible and he would have to pay totally out of pocket for the visit.  I advised him that the best I could offer him was to ask if you were okay with labs only.  Please advise.

## 2020-03-22 DIAGNOSIS — E782 Mixed hyperlipidemia: Secondary | ICD-10-CM | POA: Diagnosis not present

## 2020-03-22 DIAGNOSIS — E039 Hypothyroidism, unspecified: Secondary | ICD-10-CM | POA: Diagnosis not present

## 2020-03-22 NOTE — Telephone Encounter (Signed)
Patient advised of recommendations.  

## 2020-03-23 ENCOUNTER — Other Ambulatory Visit: Payer: Self-pay | Admitting: *Deleted

## 2020-03-23 DIAGNOSIS — E785 Hyperlipidemia, unspecified: Secondary | ICD-10-CM

## 2020-03-23 DIAGNOSIS — E039 Hypothyroidism, unspecified: Secondary | ICD-10-CM

## 2020-03-23 MED ORDER — LEVOTHYROXINE SODIUM 200 MCG PO TABS: 200.0000 ug | ORAL_TABLET | Freq: Every day | ORAL | 1 refills | Status: DC

## 2020-03-23 MED ORDER — ROSUVASTATIN CALCIUM 20 MG PO TABS: 20.0000 mg | ORAL_TABLET | Freq: Every day | ORAL | 3 refills | Status: DC

## 2020-03-23 NOTE — Telephone Encounter (Signed)
All labs are normal. 

## 2020-03-24 LAB — COMPLETE METABOLIC PANEL WITH GFR
AG Ratio: 1.9 (calc) (ref 1.0–2.5)
ALT: 35 U/L (ref 9–46)
AST: 31 U/L (ref 10–35)
Albumin: 4.8 g/dL (ref 3.6–5.1)
Alkaline phosphatase (APISO): 87 U/L (ref 35–144)
BUN: 13 mg/dL (ref 7–25)
CO2: 22 mmol/L (ref 20–32)
Calcium: 9.8 mg/dL (ref 8.6–10.3)
Chloride: 102 mmol/L (ref 98–110)
Creat: 1.06 mg/dL (ref 0.70–1.33)
GFR, Est African American: 93 mL/min/{1.73_m2} (ref >=60)
GFR, Est Non African American: 80 mL/min/{1.73_m2} (ref >=60)
Globulin: 2.5 g/dL (calc) (ref 1.9–3.7)
Glucose, Bld: 85 mg/dL (ref 65–99)
Potassium: 4.8 mmol/L (ref 3.5–5.3)
Sodium: 142 mmol/L (ref 135–146)
Total Bilirubin: 1.1 mg/dL (ref 0.2–1.2)
Total Protein: 7.3 g/dL (ref 6.1–8.1)

## 2020-03-24 LAB — LIPID PANEL W/REFLEX DIRECT LDL
Cholesterol: 167 mg/dL (ref <200)
HDL: 44 mg/dL (ref >=40)
LDL Cholesterol (Calc): 98 mg/dL (calc)
Non-HDL Cholesterol (Calc): 123 mg/dL (calc) (ref <130)
Total CHOL/HDL Ratio: 3.8 (calc) (ref <5.0)
Triglycerides: 150 mg/dL — ABNORMAL HIGH (ref <150)

## 2020-03-24 LAB — TSH: TSH: 0.86 mIU/L (ref 0.40–4.50)

## 2020-09-26 ENCOUNTER — Other Ambulatory Visit: Payer: Self-pay | Admitting: Family Medicine

## 2020-09-26 ENCOUNTER — Other Ambulatory Visit: Payer: Self-pay | Admitting: *Deleted

## 2020-09-26 DIAGNOSIS — E039 Hypothyroidism, unspecified: Secondary | ICD-10-CM

## 2020-09-26 NOTE — Telephone Encounter (Signed)
Patient stated he recently had a change in insurance & didn't know if visits were covered with his new insurance coverage? (Not sure) He wanted to know if he could have labs only, I said I would send a message and ask but he would probably need a visit as well. Patient is going to check on his insurance & get back with [place] after he figures out. AM

## 2020-09-26 NOTE — Telephone Encounter (Signed)
Call pt and schedule f/u for thyroid and TSH (non-fasting).

## 2020-09-27 NOTE — Telephone Encounter (Signed)
Advised patient of information below and he stated he was still checking on insurance but was questioning why TSH Only for Labs, that he thought he was needing cholesterol & other Labs checked as well, and he did not want to be stuck twice. He wanted this done at once. I advised patient that I would have to send a message in regards to this. AM

## 2020-09-28 ENCOUNTER — Other Ambulatory Visit: Payer: Self-pay | Admitting: *Deleted

## 2020-09-28 DIAGNOSIS — E039 Hypothyroidism, unspecified: Secondary | ICD-10-CM

## 2020-09-28 NOTE — Telephone Encounter (Signed)
Patient advised of information below and was understanding and agreeable that he will wait until December to have fasting labs done, he stated he would stop in sometime within the next week to have the TSH done. AM

## 2020-09-29 LAB — TSH: TSH: 0.02 mIU/L — ABNORMAL LOW (ref 0.40–4.50)

## 2020-10-01 NOTE — Progress Notes (Signed)
Michael Wong,   My name is Iran Planas PA-C. Dr. Jerilynn Mages is out of office for the week.   Your TSH was too low this recheck. This represents too much active thyroid medication. How long have you been on the 2104mcg dose? Have you changed the way you take it? Are you having any hyperthyroid symptoms? We would normal decrease your dose at this point and recheck but I wanted more information first.

## 2020-10-02 MED ORDER — LEVOTHYROXINE SODIUM 175 MCG PO TABS: 175.0000 ug | ORAL_TABLET | Freq: Every day | ORAL | 0 refills | Status: DC

## 2020-10-02 NOTE — Progress Notes (Signed)
Decrease to 130mcg again optimal TSH range is 1-2. Recheck labs in 2 months.

## 2020-10-02 NOTE — Addendum Note (Signed)
Addended by: Donella Stade on: 10/02/2020 02:33 PM   Modules accepted: Orders

## 2021-01-27 ENCOUNTER — Other Ambulatory Visit: Payer: Self-pay | Admitting: Physician Assistant

## 2021-04-24 ENCOUNTER — Other Ambulatory Visit: Payer: Self-pay | Admitting: Family Medicine

## 2021-04-24 DIAGNOSIS — E785 Hyperlipidemia, unspecified: Secondary | ICD-10-CM

## 2021-04-24 MED ORDER — LEVOTHYROXINE SODIUM 175 MCG PO TABS: 175.0000 ug | ORAL_TABLET | Freq: Every day | ORAL | 0 refills | Status: DC

## 2021-04-24 NOTE — Telephone Encounter (Signed)
Called patient to schedule an appt and patient stated his insurance does not cover him to have an office visit now. He stated that for a visit he gets billed for $275 or more towards his deductible and he stated he doesn't have anything wrong so could he have lab work done instead of a visit due to his insurance. AM

## 2021-04-24 NOTE — Telephone Encounter (Signed)
Needs appointment. we do not manage a condition over the phone.  And we have to have documentation for why we are ordering tests that can just be in a phone note without any type of follow-up or appointment.  I am really sorry that his insurance does not cover the visit as well and that he has such a high deductible but he has not been seen since January 2021.  I will give him a 30-day supply to make an appointment but did not set.

## 2021-04-24 NOTE — Telephone Encounter (Signed)
Please call pt and have him call the office to schedule an appointment for OV and fasting labs. Will send 30 day supply for now.

## 2021-04-24 NOTE — Telephone Encounter (Signed)
Patient stated that he is going to check with billing and with his insurance to verify the price or get an estimate of how much a virtual visit would be and he would call back to schedule the virtual visit. Patient stated he also needed a refill on Synthroid.

## 2021-06-04 ENCOUNTER — Telehealth (INDEPENDENT_AMBULATORY_CARE_PROVIDER_SITE_OTHER): Payer: BC Managed Care – PPO | Admitting: Family Medicine

## 2021-06-04 ENCOUNTER — Encounter: Payer: Self-pay | Admitting: Family Medicine

## 2021-06-04 DIAGNOSIS — R519 Headache, unspecified: Secondary | ICD-10-CM | POA: Insufficient documentation

## 2021-06-04 DIAGNOSIS — E039 Hypothyroidism, unspecified: Secondary | ICD-10-CM

## 2021-06-04 DIAGNOSIS — Z125 Encounter for screening for malignant neoplasm of prostate: Secondary | ICD-10-CM | POA: Diagnosis not present

## 2021-06-04 DIAGNOSIS — E785 Hyperlipidemia, unspecified: Secondary | ICD-10-CM

## 2021-06-04 DIAGNOSIS — G4486 Cervicogenic headache: Secondary | ICD-10-CM | POA: Diagnosis not present

## 2021-06-04 MED ORDER — ROSUVASTATIN CALCIUM 20 MG PO TABS: 20.0000 mg | ORAL_TABLET | Freq: Every day | ORAL | 3 refills | Status: DC

## 2021-06-04 MED ORDER — GABAPENTIN 100 MG PO CAPS: ORAL_CAPSULE | ORAL | 3 refills | Status: DC

## 2021-06-04 MED ORDER — METHOCARBAMOL 500 MG PO TABS: 500.0000 mg | ORAL_TABLET | Freq: Three times a day (TID) | ORAL | 2 refills | Status: DC | PRN

## 2021-06-04 NOTE — Progress Notes (Signed)
Virtual Visit via Video Note  I connected with [name] on 06/04/21 at  1:00 PM EST by a video enabled telemedicine application and verified that I am speaking with the correct person using two identifiers.   I discussed the limitations of evaluation and management by telemedicine and the availability of in person appointments. The patient expressed understanding and agreed to proceed.  Patient location: at home Provider location: in office  Subjective:    CC:  No chief complaint on file.   HPI:  Hypothyroidism - Taking medication regularly in the AM away from food and vitamins, etc. No recent change to skin, hair, or energy levels.  His thyroid medication was decreased to 175 mcg daily back in the summer.  He was supposed to return in 8 weeks to have it rechecked and never did.  Hyperlipidemia - tolerating stating well with no myalgias or significant side effects.  Lab Results  Component Value Date   CHOL 167 03/22/2020   HDL 44 03/22/2020   LDLCALC 98 03/22/2020   TRIG 150 (H) 03/22/2020   CHOLHDL 3.8 03/22/2020   Still sleeping well.    Still having frequent HA, feels like often triggered.more aggravated when he is working on his computer. Using tylenol daily and BP powder. Uses a chiropractor occ and has for years.  He just feels like it is getting a lot more tension in it.  He had an evaluation from his neck back in 2019 with Dr. Georgina Snell one of our sports med docs.  Past medical history, Surgical history, Family history not pertinant except as noted below, Social history, Allergies, and medications have been entered into the medical record, reviewed, and corrections made.    Objective:    General: Speaking clearly in complete sentences without any shortness of breath.  Alert and oriented x3.  Normal judgment. No apparent acute distress.    Impression and Recommendations:    Problem List Items Addressed This Visit       Endocrine   Hypothyroidism -  Primary    Feels like everything is going well on his current regimen due to recheck TSH.  He says he will try to go in the next week.      Relevant Orders   PSA   Lipid panel   COMPLETE METABOLIC PANEL WITH GFR   TSH     Other   Hyperlipidemia    Overdue to recheck lipids and liver enzymes.  He has also been taking a lot of Tylenol lately for the headaches and neck pain so just really encouraged him to try to go and get his labs done      Relevant Medications   rosuvastatin (CRESTOR) 20 MG tablet   Other Relevant Orders   PSA   Lipid panel   COMPLETE METABOLIC PANEL WITH GFR   Headache    Like he really has chronic daily headaches.  But it also sounds like he may have some rebound phenomenon going on with medications.  Reminded him not to take more than 3000 mg of Tylenol daily.  Like to try putting him on gabapentin to see if this is helpful for his headaches.  And did warn about potential for sedation so that we can get him off of some of the Tylenol and the [facility] powders.  Did send over a short prescription of Robaxin to use as needed as well but not daily.  Always happy to refer him back to sports med and or physical  therapy for his cervical spine if he would like.  Just encouraged him to think about it I think PT could actually be really helpful for him.      Relevant Medications   methocarbamol (ROBAXIN) 500 MG tablet   gabapentin (NEURONTIN) 100 MG capsule   Other Visit Diagnoses     Screening for prostate cancer       Relevant Orders   PSA       Orders Placed This Encounter  Procedures   PSA   Lipid panel    Order Specific Question:   Has the patient fasted?    Answer:   Yes   COMPLETE METABOLIC PANEL WITH GFR   TSH    Meds ordered this encounter  Medications   methocarbamol (ROBAXIN) 500 MG tablet    Sig: Take 1 tablet (500 mg total) by mouth every 8 (eight) hours as needed for muscle spasms.    Dispense:  30 tablet    Refill:  2   gabapentin (NEURONTIN)  100 MG capsule    Sig: Take 1 capsule (100 mg total) by mouth at bedtime for 7 days, THEN 1 capsule (100 mg total) 2 (two) times daily for 7 days, THEN 1 capsule (100 mg total) 3 (three) times daily for 7 days.    Dispense:  90 capsule    Refill:  3   rosuvastatin (CRESTOR) 20 MG tablet    Sig: Take 1 tablet (20 mg total) by mouth at bedtime. 30 day supply given. Please call office to schedule an appointment with fasting labs for refills.    Dispense:  90 tablet    Refill:  3     I discussed the assessment and treatment plan with the patient. The patient was provided an opportunity to ask questions and all were answered. The patient agreed with the plan and demonstrated an understanding of the instructions.   The patient was advised to call back or seek an in-person evaluation if the symptoms worsen or if the condition fails to improve as anticipated.   Beatrice Lecher, MD

## 2021-06-04 NOTE — Assessment & Plan Note (Addendum)
Like he really has chronic daily headaches.  But it also sounds like he may have some rebound phenomenon going on with medications.  Reminded him not to take more than 3000 mg of Tylenol daily.  Like to try putting him on gabapentin to see if this is helpful for his headaches.  And did warn about potential for sedation so that we can get him off of some of the Tylenol and the [facility] powders.  Did send over a short prescription of Robaxin to use as needed as well but not daily.  Always happy to refer him back to sports med and or physical therapy for his cervical spine if he would like.  Just encouraged him to think about it I think PT could actually be really helpful for him.

## 2021-06-04 NOTE — Assessment & Plan Note (Signed)
Overdue to recheck lipids and liver enzymes.  He has also been taking a lot of Tylenol lately for the headaches and neck pain so just really encouraged him to try to go and get his labs done

## 2021-06-04 NOTE — Assessment & Plan Note (Signed)
Feels like everything is going well on his current regimen due to recheck TSH.  He says he will try to go in the next week.

## 2021-06-19 DIAGNOSIS — E039 Hypothyroidism, unspecified: Secondary | ICD-10-CM | POA: Diagnosis not present

## 2021-06-19 DIAGNOSIS — E785 Hyperlipidemia, unspecified: Secondary | ICD-10-CM | POA: Diagnosis not present

## 2021-06-19 DIAGNOSIS — Z125 Encounter for screening for malignant neoplasm of prostate: Secondary | ICD-10-CM | POA: Diagnosis not present

## 2021-06-20 LAB — COMPLETE METABOLIC PANEL WITH GFR
AG Ratio: 1.7 (calc) (ref 1.0–2.5)
ALT: 24 U/L (ref 9–46)
AST: 20 U/L (ref 10–35)
Albumin: 4.5 g/dL (ref 3.6–5.1)
Alkaline phosphatase (APISO): 91 U/L (ref 35–144)
BUN: 18 mg/dL (ref 7–25)
CO2: 25 mmol/L (ref 20–32)
Calcium: 9.7 mg/dL (ref 8.6–10.3)
Chloride: 105 mmol/L (ref 98–110)
Creat: 0.94 mg/dL (ref 0.70–1.30)
Globulin: 2.6 g/dL (calc) (ref 1.9–3.7)
Glucose, Bld: 94 mg/dL (ref 65–99)
Potassium: 4.7 mmol/L (ref 3.5–5.3)
Sodium: 140 mmol/L (ref 135–146)
Total Bilirubin: 1.5 mg/dL — ABNORMAL HIGH (ref 0.2–1.2)
Total Protein: 7.1 g/dL (ref 6.1–8.1)
eGFR: 97 mL/min/{1.73_m2} (ref >=60)

## 2021-06-20 LAB — PSA: PSA: 0.8 ng/mL (ref <=4.00)

## 2021-06-20 LAB — LIPID PANEL
Cholesterol: 154 mg/dL (ref <200)
HDL: 41 mg/dL (ref >=40)
LDL Cholesterol (Calc): 94 mg/dL (calc)
Non-HDL Cholesterol (Calc): 113 mg/dL (calc) (ref <130)
Total CHOL/HDL Ratio: 3.8 (calc) (ref <5.0)
Triglycerides: 91 mg/dL (ref <150)

## 2021-06-20 LAB — TSH: TSH: 1.33 mIU/L (ref 0.40–4.50)

## 2021-06-20 NOTE — Progress Notes (Signed)
Call patient: All labs look great.  Remind him he is due for shingles vaccine.

## 2021-06-24 ENCOUNTER — Other Ambulatory Visit: Payer: Self-pay | Admitting: Physician Assistant

## 2021-09-13 ENCOUNTER — Encounter: Payer: Self-pay | Admitting: Family Medicine

## 2021-12-15 ENCOUNTER — Other Ambulatory Visit: Payer: Self-pay | Admitting: Family Medicine

## 2022-05-31 ENCOUNTER — Other Ambulatory Visit: Payer: Self-pay | Admitting: Family Medicine

## 2022-05-31 DIAGNOSIS — E785 Hyperlipidemia, unspecified: Secondary | ICD-10-CM

## 2022-06-18 ENCOUNTER — Other Ambulatory Visit: Payer: Self-pay | Admitting: Family Medicine

## 2022-07-07 ENCOUNTER — Telehealth: Payer: Self-pay | Admitting: Family Medicine

## 2022-07-07 ENCOUNTER — Other Ambulatory Visit: Payer: Self-pay | Admitting: Family Medicine

## 2022-07-07 DIAGNOSIS — E785 Hyperlipidemia, unspecified: Secondary | ICD-10-CM

## 2022-07-07 NOTE — Telephone Encounter (Signed)
Refill sent for 30 days. No additional refills until pt comes in for OV and fasting labs.

## 2022-07-07 NOTE — Telephone Encounter (Signed)
Pt called requesting a refill of rosuvastatin (CRESTOR) 20 MG tablet [id number] . Pt stated they will run out of this medication today. Pt has an appointment on 07/14/2022. Pt also requested to have blood work done after 07/14/2022.

## 2022-07-14 ENCOUNTER — Telehealth (INDEPENDENT_AMBULATORY_CARE_PROVIDER_SITE_OTHER): Payer: Medicaid Other | Admitting: Family Medicine

## 2022-07-14 ENCOUNTER — Encounter: Payer: Self-pay | Admitting: Family Medicine

## 2022-07-14 DIAGNOSIS — E785 Hyperlipidemia, unspecified: Secondary | ICD-10-CM

## 2022-07-14 DIAGNOSIS — R748 Abnormal levels of other serum enzymes: Secondary | ICD-10-CM

## 2022-07-14 DIAGNOSIS — E039 Hypothyroidism, unspecified: Secondary | ICD-10-CM | POA: Diagnosis not present

## 2022-07-14 NOTE — Assessment & Plan Note (Signed)
Due to recheck thyroid.  

## 2022-07-14 NOTE — Progress Notes (Signed)
Virtual Visit via Video Note  I connected with Michael Wong on 07/14/22 at  1:00 PM EDT by a video enabled telemedicine application and verified that I am speaking with the correct person using two identifiers.   I discussed the limitations of evaluation and management by telemedicine and the availability of in person appointments. The patient expressed understanding and agreed to proceed.  Patient location: at home Provider location: in office  Subjective:    CC:  No chief complaint on file.   HPI:  Hypothyroidism - Taking medication regularly in the AM away from food and vitamins, etc. No recent change to skin, hair, or energy levels.  Hyperlipidemia - tolerating stating well with no myalgias or significant side effects.  Lab Results  Component Value Date   CHOL 154 06/19/2021   HDL 41 06/19/2021   LDLCALC 94 06/19/2021   TRIG 91 06/19/2021   CHOLHDL 3.8 06/19/2021    Had a few days where didn't feel well and had nasuea and diarrhea Felt similar to when his bilirubin went up in the past. Noticed more fatty stool. Hx of elevated liver enzymes and bili in 2017.    Past medical history, Surgical history, Family history not pertinant except as noted below, Social history, Allergies, and medications have been entered into the medical record, reviewed, and corrections made.    Objective:    General: Speaking clearly in complete sentences without any shortness of breath.  Alert and oriented x3.  Normal judgment. No apparent acute distress.    Impression and Recommendations:    Problem List Items Addressed This Visit       Endocrine   Hypothyroidism - Primary    Due to recheck thyroid.       Relevant Orders   PSA   Lipid panel   COMPLETE METABOLIC PANEL WITH GFR   CBC   TSH     Other   Hyperlipidemia   Relevant Orders   PSA   Lipid panel   COMPLETE METABOLIC PANEL WITH GFR   CBC   TSH   Hyperbilirubinemia   Relevant Orders   PSA   Lipid panel    COMPLETE METABOLIC PANEL WITH GFR   CBC   TSH   Iron, TIBC and Ferritin Panel   Tissue Transglutaminase Abs,IgG,IgA   Protime-INR   Mitochondrial antibodies   Anti-smooth muscle antibody, IgG   ANA   IgA   Other Visit Diagnoses     Elevated liver enzymes       Relevant Orders   Iron, TIBC and Ferritin Panel   Tissue Transglutaminase Abs,IgG,IgA   Protime-INR   Mitochondrial antibodies   Anti-smooth muscle antibody, IgG   ANA   IgA   AFP tumor marker       Orders Placed This Encounter  Procedures   PSA   Lipid panel    Order Specific Question:   Has the patient fasted?    Answer:   Yes   COMPLETE METABOLIC PANEL WITH GFR   CBC   TSH   Iron, TIBC and Ferritin Panel   Tissue Transglutaminase Abs,IgG,IgA   Protime-INR   Mitochondrial antibodies   Anti-smooth muscle antibody, IgG   ANA   IgA   AFP tumor marker    He had several days of similar symptoms back when he had a significant elevation in liver enzymes and bilirubin back in 2017 and was evaluated by GI.  They never found a cause and it eventually resolved on its  own.  I will get some up-to-date liver enzymes function and labs today as well.  He is can to Try to come by first thing in the morning fasting to have those done.  No orders of the defined types were placed in this encounter.    I discussed the assessment and treatment plan with the patient. The patient was provided an opportunity to ask questions and all were answered. The patient agreed with the plan and demonstrated an understanding of the instructions.   The patient was advised to call back or seek an in-person evaluation if the symptoms worsen or if the condition fails to improve as anticipated.   Beatrice Lecher, MD

## 2022-07-15 DIAGNOSIS — R748 Abnormal levels of other serum enzymes: Secondary | ICD-10-CM | POA: Diagnosis not present

## 2022-07-15 DIAGNOSIS — E039 Hypothyroidism, unspecified: Secondary | ICD-10-CM | POA: Diagnosis not present

## 2022-07-15 DIAGNOSIS — E785 Hyperlipidemia, unspecified: Secondary | ICD-10-CM | POA: Diagnosis not present

## 2022-07-17 MED ORDER — ROSUVASTATIN CALCIUM 20 MG PO TABS: 20.0000 mg | ORAL_TABLET | Freq: Every day | ORAL | 3 refills | Status: DC

## 2022-07-17 MED ORDER — LEVOTHYROXINE SODIUM 175 MCG PO TABS: 175.0000 ug | ORAL_TABLET | Freq: Every day | ORAL | 1 refills | Status: DC

## 2022-07-17 NOTE — Addendum Note (Signed)
Addended by: Rae Lips on: 07/17/2022 03:04 PM   Modules accepted: Orders

## 2022-07-17 NOTE — Addendum Note (Signed)
Addended by: Beatrice Lecher D on: 07/17/2022 05:22 PM   Modules accepted: Orders

## 2022-07-17 NOTE — Progress Notes (Signed)
Okay, have him take an extra half a tab on Sundays.  Send will take 1 whole tab Monday through Saturday and 1-1/2 tabs on Sunday.  Repeat this pattern each week and then we will plan to recheck TSH in 8 weeks.  New prescription sent and updated to pharmacy for both medications.

## 2022-07-17 NOTE — Progress Notes (Signed)
Hi Michael Wong, your thyroid is not at goal.  I am going to need to update your medication.  Please verify how exactly you are taking your thyroid medication.  If you ran out of medication or missed several doses lately then please let me know.  Metabolic panel, blood count and prostate test are all normal.  Still awaiting some of the additional liver levels.

## 2022-07-18 ENCOUNTER — Other Ambulatory Visit: Payer: Self-pay

## 2022-07-18 DIAGNOSIS — E039 Hypothyroidism, unspecified: Secondary | ICD-10-CM

## 2022-07-22 LAB — LIPID PANEL
Cholesterol: 169 mg/dL (ref <200)
HDL: 51 mg/dL (ref >=40)
LDL Cholesterol (Calc): 99 mg/dL (calc)
Non-HDL Cholesterol (Calc): 118 mg/dL (calc) (ref <130)
Total CHOL/HDL Ratio: 3.3 (calc) (ref <5.0)
Triglycerides: 99 mg/dL (ref <150)

## 2022-07-22 LAB — CBC
HCT: 45.7 % (ref 38.5–50.0)
Hemoglobin: 14.9 g/dL (ref 13.2–17.1)
MCH: 29.1 pg (ref 27.0–33.0)
MCHC: 32.6 g/dL (ref 32.0–36.0)
MCV: 89.3 fL (ref 80.0–100.0)
MPV: 9.7 fL (ref 7.5–12.5)
Platelets: 288 10*3/uL (ref 140–400)
RBC: 5.12 10*6/uL (ref 4.20–5.80)
RDW: 12.6 % (ref 11.0–15.0)
WBC: 5.4 10*3/uL (ref 3.8–10.8)

## 2022-07-22 LAB — COMPLETE METABOLIC PANEL WITHOUT GFR
AG Ratio: 1.7 (calc) (ref 1.0–2.5)
ALT: 22 U/L (ref 9–46)
AST: 19 U/L (ref 10–35)
Albumin: 4.7 g/dL (ref 3.6–5.1)
Alkaline phosphatase (APISO): 81 U/L (ref 35–144)
BUN: 16 mg/dL (ref 7–25)
CO2: 27 mmol/L (ref 20–32)
Calcium: 9.7 mg/dL (ref 8.6–10.3)
Chloride: 102 mmol/L (ref 98–110)
Creat: 0.89 mg/dL (ref 0.70–1.30)
Globulin: 2.7 g/dL (ref 1.9–3.7)
Glucose, Bld: 97 mg/dL (ref 65–99)
Potassium: 4.4 mmol/L (ref 3.5–5.3)
Sodium: 138 mmol/L (ref 135–146)
Total Bilirubin: 0.9 mg/dL (ref 0.2–1.2)
Total Protein: 7.4 g/dL (ref 6.1–8.1)
eGFR: 102 mL/min/{1.73_m2}

## 2022-07-22 LAB — PROTIME-INR
INR: 1
Prothrombin Time: 10.5 s (ref 9.0–11.5)

## 2022-07-22 LAB — ANTI-NUCLEAR AB-TITER (ANA TITER): ANA Titer 1: 1:40 {titer} — ABNORMAL HIGH

## 2022-07-22 LAB — TISSUE TRANSGLUTAMINASE ABS,IGG,IGA
(tTG) Ab, IgA: <1 U/mL
(tTG) Ab, IgG: <1 U/mL

## 2022-07-22 LAB — PSA: PSA: 1.19 ng/mL (ref <=4.00)

## 2022-07-22 LAB — ANTI-SMOOTH MUSCLE ANTIBODY, IGG: Actin (Smooth Muscle) Antibody (IGG): <20 U (ref <20)

## 2022-07-22 LAB — AFP TUMOR MARKER: AFP-Tumor Marker: 2.7 ng/mL (ref <6.1)

## 2022-07-22 LAB — TSH: TSH: 9.82 mIU/L — ABNORMAL HIGH (ref 0.40–4.50)

## 2022-07-22 LAB — IRON,TIBC AND FERRITIN PANEL
%SAT: 25 % (ref 20–48)
Ferritin: 52 ng/mL (ref 38–380)
Iron: 89 ug/dL (ref 50–180)
TIBC: 357 ug/dL (ref 250–425)

## 2022-07-22 LAB — MITOCHONDRIAL ANTIBODIES: Mitochondrial M2 Ab, IgG: <=20 U (ref <=20.0)

## 2022-07-22 LAB — IGA: Immunoglobulin A: 284 mg/dL (ref 47–310)

## 2022-07-22 LAB — ANA: Anti Nuclear Antibody (ANA): POSITIVE — AB

## 2022-07-23 NOTE — Progress Notes (Signed)
Hi Rajeev, prostate test level is normal.  Negative for celiac.  Additional liver enzymes were normal which is really reassuring.

## 2022-07-30 ENCOUNTER — Other Ambulatory Visit: Payer: Self-pay | Admitting: *Deleted

## 2022-07-30 DIAGNOSIS — E785 Hyperlipidemia, unspecified: Secondary | ICD-10-CM

## 2022-07-30 MED ORDER — ROSUVASTATIN CALCIUM 20 MG PO TABS
20.0000 mg | ORAL_TABLET | Freq: Every day | ORAL | 3 refills | Status: DC
Start: 2022-07-30 — End: 2023-07-07

## 2022-07-31 ENCOUNTER — Encounter: Payer: Self-pay | Admitting: Podiatry

## 2022-07-31 ENCOUNTER — Ambulatory Visit: Payer: Medicaid Other | Admitting: Podiatry

## 2022-07-31 DIAGNOSIS — L600 Ingrowing nail: Secondary | ICD-10-CM

## 2022-07-31 DIAGNOSIS — L603 Nail dystrophy: Secondary | ICD-10-CM

## 2022-07-31 NOTE — Patient Instructions (Signed)

## 2022-07-31 NOTE — Progress Notes (Signed)
  Subjective:  Patient ID: Michael Wong, male    DOB: [birth year],   MRN: [id number]  Chief Complaint  Patient presents with   Ingrown Toenail    Left great toenail ongoing for 2 years    57 y.o. male presents for concern of left thickened and dystrophic toenail as well as ingrown nail on the right. Relates 2 years ago he dropped a large piece of furniture on his left toe and the nail fell off since then the nail has grown thick and recently started to become painful. Also relates left ingrown nail procedure when he was a kid and had permanent procedure but now they have come back.  . Denies any other pedal complaints. Denies n/v/f/c.   Past Medical History:  Diagnosis Date   Hyperlipidemia    Thyroid disease     Objective:  Physical Exam: Vascular: DP/PT pulses 2/4 bilateral. CFT <3 seconds. Normal hair growth on digits. No edema.  Skin. No lacerations or abrasions bilateral feet. Incurvation of bilateral borders of right toe. No erythema edema or purulence noted. Left hallux nail is thickened and dystrophic with subungual debris Musculoskeletal: MMT 5/5 bilateral lower extremities in DF, PF, Inversion and Eversion. Deceased ROM in DF of ankle joint.  Neurological: Sensation intact to light touch.   Assessment:   1. Ingrown right greater toenail   2. Dystrophic nail      Plan:  Patient was evaluated and treated and all questions answered. Discussed ingrown toenails etiology and treatment options including procedure for removal vs conservative care.  Patient requesting removal of ingrown nail today. Procedure below.  Discussed procedure and post procedure care and patient expressed understanding.  Debrided left hallux nail as courtesy. Will consider permanent removal in the future.  Will follow-up in 2 weeks for nail check or sooner if any problems arise.    Procedure:  Procedure: partial Nail Avulsion of right hallux bilateral nail border.  Surgeon: Louann Sjogren, DPM   Pre-op Dx: Ingrown toenail without infection Post-op: Same  Place of Surgery: Office exam room.  Indications for surgery: Painful and ingrown toenail.    The patient is requesting removal of nail with chemical matrixectomy. Risks and complications were discussed with the patient for which they understand and written consent was obtained. Under sterile conditions a total of 3 mL of  1% lidocaine plain was infiltrated in a hallux block fashion. Once anesthetized, the skin was prepped in sterile fashion. A tourniquet was then applied. Next the bilateral aspect of hallux nail border was then sharply excised making sure to remove the entire offending nail border.  Next phenol was then applied under standard conditions and copiously irrigated. Silvadene was applied. A dry sterile dressing was applied. After application of the dressing the tourniquet was removed and there is found to be an immediate capillary refill time to the digit. The patient tolerated the procedure well without any complications. Post procedure instructions were discussed the patient for which he verbally understood. Follow-up in two weeks for nail check or sooner if any problems are to arise. Discussed signs/symptoms of infection and directed to call the office immediately should any occur or go directly to the emergency room. In the meantime, encouraged to call the office with any questions, concerns, changes symptoms.   Louann Sjogren, DPM

## 2022-10-30 DIAGNOSIS — E039 Hypothyroidism, unspecified: Secondary | ICD-10-CM | POA: Diagnosis not present

## 2022-10-31 LAB — TSH: TSH: 2.41 u[IU]/mL (ref 0.450–4.500)

## 2022-10-31 NOTE — Progress Notes (Signed)
HI Michael Wong your thyroid looks great!!!

## 2023-02-19 ENCOUNTER — Telehealth: Payer: Self-pay | Admitting: Family Medicine

## 2023-02-19 MED ORDER — LEVOTHYROXINE SODIUM 175 MCG PO TABS: 175.0000 ug | ORAL_TABLET | Freq: Every day | ORAL | 1 refills | Status: DC

## 2023-02-19 NOTE — Telephone Encounter (Signed)
Medication sent.

## 2023-02-19 NOTE — Telephone Encounter (Signed)
Patient called requesting a refill of ; levothyroxine (SYNTHROID) 175 MCG tablet [[id number]] and rosuvastatin (CRESTOR) 20 MG tablet [[id number]]   Pharmacy ;   [facility] - [place], [place] - [address]

## 2023-02-27 ENCOUNTER — Ambulatory Visit: Payer: Medicaid Other | Admitting: Family Medicine

## 2023-02-27 ENCOUNTER — Encounter: Payer: Self-pay | Admitting: Family Medicine

## 2023-02-27 VITALS — BP 132/91 | HR 96 | Temp 98.2°F | Ht 72.0 in | Wt 246.0 lb

## 2023-02-27 DIAGNOSIS — R6889 Other general symptoms and signs: Secondary | ICD-10-CM

## 2023-02-27 DIAGNOSIS — J029 Acute pharyngitis, unspecified: Secondary | ICD-10-CM | POA: Diagnosis not present

## 2023-02-27 DIAGNOSIS — R17 Unspecified jaundice: Secondary | ICD-10-CM | POA: Diagnosis not present

## 2023-02-27 DIAGNOSIS — J020 Streptococcal pharyngitis: Secondary | ICD-10-CM | POA: Diagnosis not present

## 2023-02-27 DIAGNOSIS — E039 Hypothyroidism, unspecified: Secondary | ICD-10-CM

## 2023-02-27 LAB — POC COVID19 BINAXNOW: SARS Coronavirus 2 Ag: NEGATIVE

## 2023-02-27 LAB — POCT INFLUENZA A/B
Influenza A, POC: NEGATIVE
Influenza B, POC: NEGATIVE

## 2023-02-27 LAB — POCT RAPID STREP A (OFFICE): Rapid Strep A Screen: POSITIVE — AB

## 2023-02-27 MED ORDER — AMOXICILLIN 500 MG PO TABS: 500.0000 mg | ORAL_TABLET | Freq: Two times a day (BID) | ORAL | 0 refills | Status: AC

## 2023-02-27 NOTE — Assessment & Plan Note (Signed)
Treating with course of amoxicilline 500mg  BID x10 days.  Red flags and precautions discussed.

## 2023-02-27 NOTE — Patient Instructions (Signed)

## 2023-02-27 NOTE — Progress Notes (Signed)
Michael Wong - 57 y.o. male MRN [id number]  Date of birth: 1965/08/19  Subjective Chief Complaint  Patient presents with   Fever   Chills   Shortness of Breath   Fatigue   Sore Throat    HPI Michael Wong is a 57 y.o. male here today with complaint of sore throat, body aches, fatigue, and fever.  Symptoms started a couple of days ago.  Reports that family has had strep recently.  He is eating and drinking normally.  He has tried tylenol and goody powders to help with symptoms.  He does note that he takes tylenol and goody powders on a regular basis for headache.  He occasionally has some sores on his scalp that appear that he feels seem to make his headaches worse.  He would like to have his liver function rechecked and have TSH updated  ROS:  A comprehensive ROS was completed and negative except as noted per HPI  No Known Allergies  Past Medical History:  Diagnosis Date   Hyperlipidemia    Thyroid disease     Past Surgical History:  Procedure Laterality Date   APPENDECTOMY      Social History   Socioeconomic History   Marital status: Married    Spouse name: Not on file   Number of children: Not on file   Years of education: Not on file   Highest education level: Not on file  Occupational History   Not on file  Tobacco Use   Smoking status: Never   Smokeless tobacco: Never  Substance and Sexual Activity   Alcohol use: No   Drug use: No   Sexual activity: Not on file    Comment: BS degree, single, 2 kids,caffeine2-3 drinks daily, no exercise  Other Topics Concern   Not on file  Social History Narrative   Not on file   Social Determinants of Health   Financial Resource Strain: Not on file  Food Insecurity: Not on file  Transportation Needs: Not on file  Physical Activity: Not on file  Stress: Not on file  Social Connections: Not on file    History reviewed. No pertinent family history.  Health Maintenance  Topic Date Due   Zoster Vaccines-  Shingrix (2 of 2) 10/24/2021   INFLUENZA VACCINE  Never done   COVID-19 Vaccine (3 - 2023-24 season) 12/14/2022   DTaP/Tdap/Td (3 - Td or Tdap) 07/18/2027   Colonoscopy  02/13/2028   Hepatitis C Screening  Completed   HIV Screening  Completed   Pneumococcal Vaccine 20-58 Years old  Aged Out   HPV VACCINES  Aged Out     ----------------------------------------------------------------------------------------------------------------------------------------------------------------------------------------------------------------- Physical Exam BP (!) 132/91 (BP Location: Left Arm, Patient Position: Sitting, Cuff Size: Large)   Pulse 96   Temp 98.2 F (36.8 C) (Oral)   Ht 6' (1.829 m)   Wt 246 lb (111.6 kg)   SpO2 97%   BMI 33.36 kg/m   Physical Exam Constitutional:      Appearance: Normal appearance.  HENT:     Head: Normocephalic and atraumatic.     Mouth/Throat:     Pharynx: Posterior oropharyngeal erythema present.  Cardiovascular:     Rate and Rhythm: Normal rate and regular rhythm.  Pulmonary:     Effort: Pulmonary effort is normal.     Breath sounds: Normal breath sounds.  Musculoskeletal:     Cervical back: Neck supple.  Neurological:     General: No focal deficit present.     Mental  Status: He is alert.  Psychiatric:        Mood and Affect: Mood normal.        Behavior: Behavior normal.     ------------------------------------------------------------------------------------------------------------------------------------------------------------------------------------------------------------------- Assessment and Plan  Headache Using tylenol and headache powders fairly regularly.  Update hepatic function.  Discussed trying to limit these as there is likely rebound component.    Strep pharyngitis Treating with course of amoxicilline 500mg  BID x10 days.  Red flags and precautions discussed.     Meds ordered this encounter  Medications   amoxicillin (AMOXIL)  500 MG tablet    Sig: Take 1 tablet (500 mg total) by mouth 2 (two) times daily for 10 days.    Dispense:  20 tablet    Refill:  0    No follow-ups on file.    This visit occurred during the SARS-CoV-2 public health emergency.  Safety protocols were in place, including screening questions prior to the visit, additional usage of staff PPE, and extensive cleaning of exam room while observing appropriate contact time as indicated for disinfecting solutions.

## 2023-02-27 NOTE — Assessment & Plan Note (Signed)
Using tylenol and headache powders fairly regularly.  Update hepatic function.  Discussed trying to limit these as there is likely rebound component.

## 2023-02-28 LAB — HEPATIC FUNCTION PANEL
ALT: 20 [IU]/L (ref 0–44)
AST: 22 [IU]/L (ref 0–40)
Albumin: 4.7 g/dL (ref 3.8–4.9)
Alkaline Phosphatase: 118 [IU]/L (ref 44–121)
Bilirubin Total: 0.7 mg/dL (ref 0.0–1.2)
Bilirubin, Direct: 0.2 mg/dL (ref 0.00–0.40)
Total Protein: 7.3 g/dL (ref 6.0–8.5)

## 2023-02-28 LAB — TSH: TSH: 1.19 u[IU]/mL (ref 0.450–4.500)

## 2023-07-07 ENCOUNTER — Other Ambulatory Visit: Payer: Self-pay

## 2023-07-07 DIAGNOSIS — R030 Elevated blood-pressure reading, without diagnosis of hypertension: Secondary | ICD-10-CM

## 2023-07-07 DIAGNOSIS — E039 Hypothyroidism, unspecified: Secondary | ICD-10-CM

## 2023-07-07 DIAGNOSIS — E7849 Other hyperlipidemia: Secondary | ICD-10-CM

## 2023-07-07 DIAGNOSIS — E785 Hyperlipidemia, unspecified: Secondary | ICD-10-CM

## 2023-07-07 MED ORDER — LEVOTHYROXINE SODIUM 175 MCG PO TABS: 175.0000 ug | ORAL_TABLET | Freq: Every day | ORAL | 1 refills | Status: DC

## 2023-07-07 MED ORDER — ROSUVASTATIN CALCIUM 20 MG PO TABS: 20.0000 mg | ORAL_TABLET | Freq: Every day | ORAL | 1 refills | Status: DC

## 2023-07-07 NOTE — Telephone Encounter (Signed)
 Meds ordered this encounter  Medications   levothyroxine (SYNTHROID) 175 MCG tablet    Sig: Take 1 tablet (175 mcg total) by mouth daily. Take extra half tab on Sundays.    Dispense:  102 tablet    Refill:  1   rosuvastatin (CRESTOR) 20 MG tablet    Sig: Take 1 tablet (20 mg total) by mouth daily.    Dispense:  90 tablet    Refill:  1

## 2023-07-07 NOTE — Telephone Encounter (Signed)
 Copied from CRM [id number]. Topic: Clinical - Request for Lab/Test Order >> Jul 07, 2023  8:26 AM Geroge Baseman wrote: Reason for CRM: Patient would like to have tests lab tests ordered labs before March 31, for med refills.  He said his insurance is going to lapse for a bit after that date and he needs to get it completed before. Please call when/if lab orders are ready.

## 2023-07-07 NOTE — Telephone Encounter (Signed)
 Labs and med refills pended for provider's review.

## 2023-07-13 DIAGNOSIS — E7849 Other hyperlipidemia: Secondary | ICD-10-CM | POA: Diagnosis not present

## 2023-07-13 DIAGNOSIS — E039 Hypothyroidism, unspecified: Secondary | ICD-10-CM | POA: Diagnosis not present

## 2023-07-13 DIAGNOSIS — R030 Elevated blood-pressure reading, without diagnosis of hypertension: Secondary | ICD-10-CM | POA: Diagnosis not present

## 2023-07-14 LAB — CBC WITH DIFFERENTIAL/PLATELET
Basophils Absolute: 0 10*3/uL (ref 0.0–0.2)
Basos: 1 %
EOS (ABSOLUTE): 0.2 10*3/uL (ref 0.0–0.4)
Eos: 4 %
Hematocrit: 43.7 % (ref 37.5–51.0)
Hemoglobin: 14.2 g/dL (ref 13.0–17.7)
Immature Grans (Abs): 0 10*3/uL (ref 0.0–0.1)
Immature Granulocytes: 0 %
Lymphocytes Absolute: 2.2 10*3/uL (ref 0.7–3.1)
Lymphs: 40 %
MCH: 29.3 pg (ref 26.6–33.0)
MCHC: 32.5 g/dL (ref 31.5–35.7)
MCV: 90 fL (ref 79–97)
Monocytes Absolute: 0.6 10*3/uL (ref 0.1–0.9)
Monocytes: 12 %
Neutrophils Absolute: 2.4 10*3/uL (ref 1.4–7.0)
Neutrophils: 43 %
Platelets: 255 10*3/uL (ref 150–450)
RBC: 4.84 x10E6/uL (ref 4.14–5.80)
RDW: 13.3 % (ref 11.6–15.4)
WBC: 5.4 10*3/uL (ref 3.4–10.8)

## 2023-07-14 LAB — LIPID PANEL WITH LDL/HDL RATIO
Cholesterol, Total: 136 mg/dL (ref 100–199)
HDL: 38 mg/dL — ABNORMAL LOW (ref >39)
LDL Chol Calc (NIH): 79 mg/dL (ref 0–99)
LDL/HDL Ratio: 2.1 ratio (ref 0.0–3.6)
Triglycerides: 104 mg/dL (ref 0–149)
VLDL Cholesterol Cal: 19 mg/dL (ref 5–40)

## 2023-07-14 LAB — COMPREHENSIVE METABOLIC PANEL WITH GFR
ALT: 26 IU/L (ref 0–44)
AST: 23 IU/L (ref 0–40)
Albumin: 4.5 g/dL (ref 3.8–4.9)
Alkaline Phosphatase: 88 IU/L (ref 44–121)
BUN/Creatinine Ratio: 20 (ref 9–20)
BUN: 19 mg/dL (ref 6–24)
Bilirubin Total: 1.4 mg/dL — ABNORMAL HIGH (ref 0.0–1.2)
CO2: 22 mmol/L (ref 20–29)
Calcium: 9.2 mg/dL (ref 8.7–10.2)
Chloride: 105 mmol/L (ref 96–106)
Creatinine, Ser: 0.96 mg/dL (ref 0.76–1.27)
Globulin, Total: 2.2 g/dL (ref 1.5–4.5)
Glucose: 92 mg/dL (ref 70–99)
Potassium: 4.4 mmol/L (ref 3.5–5.2)
Sodium: 139 mmol/L (ref 134–144)
Total Protein: 6.7 g/dL (ref 6.0–8.5)
eGFR: 93 mL/min/{1.73_m2} (ref >59)

## 2023-07-14 LAB — TSH: TSH: 0.887 u[IU]/mL (ref 0.450–4.500)

## 2023-07-15 ENCOUNTER — Encounter: Payer: Self-pay | Admitting: Family Medicine

## 2023-07-15 NOTE — Progress Notes (Signed)
 Hi Michael Wong, metabolic panel overall looks good bilirubin slightly elevated but stable for you.  Cholesterol also overall looks good.  Thyroid looks good.  Optimal is between 1 and 2 is just a smidge below 1 4 not going to make any changes today we will just continue to monitor and plan to recheck again in 6 months.  Blood count is normal.

## 2023-08-17 ENCOUNTER — Encounter: Payer: Self-pay | Admitting: Family Medicine

## 2023-12-30 ENCOUNTER — Ambulatory Visit: Admitting: Family Medicine

## 2023-12-30 ENCOUNTER — Encounter: Payer: Self-pay | Admitting: Family Medicine

## 2023-12-30 VITALS — BP 144/95 | HR 63 | Ht 72.0 in | Wt 251.0 lb

## 2023-12-30 DIAGNOSIS — E039 Hypothyroidism, unspecified: Secondary | ICD-10-CM

## 2023-12-30 DIAGNOSIS — I152 Hypertension secondary to endocrine disorders: Secondary | ICD-10-CM

## 2023-12-30 DIAGNOSIS — G4486 Cervicogenic headache: Secondary | ICD-10-CM | POA: Diagnosis not present

## 2023-12-30 DIAGNOSIS — E785 Hyperlipidemia, unspecified: Secondary | ICD-10-CM | POA: Diagnosis not present

## 2023-12-30 DIAGNOSIS — I10 Essential (primary) hypertension: Secondary | ICD-10-CM | POA: Diagnosis not present

## 2023-12-30 DIAGNOSIS — E1159 Type 2 diabetes mellitus with other circulatory complications: Secondary | ICD-10-CM

## 2023-12-30 MED ORDER — ROSUVASTATIN CALCIUM 20 MG PO TABS: 20.0000 mg | ORAL_TABLET | Freq: Every day | ORAL | 3 refills | Status: AC

## 2023-12-30 MED ORDER — LOSARTAN POTASSIUM 50 MG PO TABS: 50.0000 mg | ORAL_TABLET | Freq: Every day | ORAL | 1 refills | Status: AC

## 2023-12-30 NOTE — Assessment & Plan Note (Signed)
 For updated lipid panel to make sure that rosuvastatin  dosing is adequate.

## 2023-12-30 NOTE — Assessment & Plan Note (Signed)
 Liver enzymes and bilirubin.

## 2023-12-30 NOTE — Progress Notes (Signed)
 Established Patient Office Visit  Subjective  Patient ID: PINCHUS WECKWERTH, male    DOB: [birth year]  Age: 58 y.o. MRN: [id number]  Chief Complaint  Patient presents with   Hypertension    Pt reports that he has been checking his bp at home and it has been running in the 140's over 90's for sometime.    Weight Gain    HPI Discussed the use of AI scribe software for clinical note transcription with the patient, who gave verbal consent to proceed.  History of Present Illness Michael Wong is a 58 year old male who presents with elevated blood pressure readings at home.  Elevated blood pressure - Consistently elevated home blood pressure readings ranging from 140/90 to 160/94 mmHg - Regularly monitors blood pressure at home, also assists with wife's blood pressure management - Previously treated with a diuretic, discontinued when levothyroxine  was initiated - No recent chest pain, shortness of breath, or palpitations  Weight gain - Recent weight gain of 5 pounds. He wouldlike to be 210 - Previously lost weight, during which time blood pressure was well controlled and headaches decreased - Believes weight gain may be contributing to elevated blood pressure - Interested in dietary modifications, including reducing salt intake and following the DASH diet, but enjoys salty foods  Headache - Experiences headaches, which he associates with elevated blood pressure - Headaches improved previously with weight loss - Uses BC powders and coffee in the morning for headache  and joint ache relief - Naproxen has been ineffective for headache management - Recently started magnesium, which may be providing slight improvement     ROS    Objective:     BP (!) 144/95   Pulse 63   Ht 6' (1.829 m)   Wt 251 lb (113.9 kg)   SpO2 98%   BMI 34.04 kg/m    Physical Exam Vitals and nursing note reviewed.  Constitutional:      Appearance: Normal appearance.  HENT:     Head:  Normocephalic and atraumatic.  Eyes:     Conjunctiva/sclera: Conjunctivae normal.  Cardiovascular:     Rate and Rhythm: Normal rate and regular rhythm.  Pulmonary:     Effort: Pulmonary effort is normal.     Breath sounds: Normal breath sounds.  Skin:    General: Skin is warm and dry.  Neurological:     Mental Status: He is alert.  Psychiatric:        Mood and Affect: Mood normal.      No results found for any visits on 12/30/23.    The 10-year ASCVD risk score (Arnett DK, et al., 2019) is: 6.6%    Assessment & Plan:   Problem List Items Addressed This Visit       Cardiovascular and Mediastinum   Hypertension - Primary   Relevant Medications   losartan  (COZAAR ) 50 MG tablet   rosuvastatin  (CRESTOR ) 20 MG tablet     Endocrine   Hypothyroidism   Relevant Orders   CMP14+EGFR   Lipid panel   CBC   TSH     Other   Hyperlipidemia   For updated lipid panel to make sure that rosuvastatin  dosing is adequate.      Relevant Medications   losartan  (COZAAR ) 50 MG tablet   rosuvastatin  (CRESTOR ) 20 MG tablet   Hyperbilirubinemia   Liver enzymes and bilirubin.      Headache   Assessment and Plan Assessment & Plan Hypertension Blood pressure consistently 140/90 to  160/94. Weight gain may contribute. Lifestyle changes and medication needed to reach <130/85. - Start losartan  for kidney protection and low side effects. - Order baseline labs: kidney function, potassium. - Recheck potassium, kidney function in 2 weeks. - Provide DASH diet handout. - Encourage weight loss, low salt diet, regular exercise. - Monitor blood pressure at home, report concerns or side effects.  Headache Chronic headaches possibly linked to hypertension, though he also gets a lot of neck and upper back pain as well. BC powders may cause gastrointestinal issues. Naproxen ineffective. BC powder use risks stomach ulcers, liver impact. - Order liver function tests. - Discuss risks of BC powder  use, consider alternative pain management.    Return for Nurse BP Check and BMP.    Dorothyann Byars, MD

## 2023-12-31 ENCOUNTER — Ambulatory Visit: Payer: Self-pay | Admitting: Family Medicine

## 2023-12-31 LAB — CMP14+EGFR
ALT: 22 IU/L (ref 0–44)
AST: 22 IU/L (ref 0–40)
Albumin: 4.4 g/dL (ref 3.8–4.9)
Alkaline Phosphatase: 91 IU/L (ref 47–123)
BUN/Creatinine Ratio: 13 (ref 9–20)
BUN: 12 mg/dL (ref 6–24)
Bilirubin Total: 0.7 mg/dL (ref 0.0–1.2)
CO2: 23 mmol/L (ref 20–29)
Calcium: 9.3 mg/dL (ref 8.7–10.2)
Chloride: 104 mmol/L (ref 96–106)
Creatinine, Ser: 0.95 mg/dL (ref 0.76–1.27)
Globulin, Total: 2.4 g/dL (ref 1.5–4.5)
Glucose: 85 mg/dL (ref 70–99)
Potassium: 4.4 mmol/L (ref 3.5–5.2)
Sodium: 139 mmol/L (ref 134–144)
Total Protein: 6.8 g/dL (ref 6.0–8.5)
eGFR: 95 mL/min/1.73 (ref >59)

## 2023-12-31 LAB — LIPID PANEL
Chol/HDL Ratio: 3.8 ratio (ref 0.0–5.0)
Cholesterol, Total: 146 mg/dL (ref 100–199)
HDL: 38 mg/dL — ABNORMAL LOW (ref >39)
LDL Chol Calc (NIH): 87 mg/dL (ref 0–99)
Triglycerides: 116 mg/dL (ref 0–149)
VLDL Cholesterol Cal: 21 mg/dL (ref 5–40)

## 2023-12-31 LAB — TSH: TSH: 0.312 u[IU]/mL — ABNORMAL LOW (ref 0.450–4.500)

## 2023-12-31 LAB — CBC
Hematocrit: 42.8 % (ref 37.5–51.0)
Hemoglobin: 13.8 g/dL (ref 13.0–17.7)
MCH: 29.3 pg (ref 26.6–33.0)
MCHC: 32.2 g/dL (ref 31.5–35.7)
MCV: 91 fL (ref 79–97)
Platelets: 250 x10E3/uL (ref 150–450)
RBC: 4.71 x10E6/uL (ref 4.14–5.80)
RDW: 12.7 % (ref 11.6–15.4)
WBC: 5.5 x10E3/uL (ref 3.4–10.8)

## 2023-12-31 NOTE — Progress Notes (Signed)
 Hi Caspar, LDL cholesterol and total cholesterol look good.  Your HDL is a little lower than it used to be.  Regular exercise will help bring that up.  Your thyroid  is a little overly suppressed so I would just go back to taking a whole tab daily.  You do not need the extra half a tab that you were taking weekly and then plan to recheck your level again in about 3 months.  Metabolic panel including liver and kidney function is normal.  Blood count is normal.  No anemia.

## 2024-01-15 NOTE — Addendum Note (Signed)
 Addended by: Brycelynn Stampley P on: 01/15/2024 11:51 AM   Modules accepted: Orders

## 2024-01-16 LAB — BMP8+EGFR
BUN/Creatinine Ratio: 15 (ref 9–20)
BUN: 17 mg/dL (ref 6–24)
CO2: 22 mmol/L (ref 20–29)
Calcium: 9.5 mg/dL (ref 8.7–10.2)
Chloride: 104 mmol/L (ref 96–106)
Creatinine, Ser: 1.12 mg/dL (ref 0.76–1.27)
Glucose: 81 mg/dL (ref 70–99)
Potassium: 4.8 mmol/L (ref 3.5–5.2)
Sodium: 141 mmol/L (ref 134–144)
eGFR: 77 mL/min/1.73 (ref >59)

## 2024-01-18 NOTE — Progress Notes (Signed)
 Your lab work is within acceptable range and there are no concerning findings.   ?

## 2024-02-10 ENCOUNTER — Other Ambulatory Visit: Payer: Self-pay | Admitting: *Deleted

## 2024-02-10 DIAGNOSIS — E039 Hypothyroidism, unspecified: Secondary | ICD-10-CM

## 2024-02-10 MED ORDER — LEVOTHYROXINE SODIUM 175 MCG PO TABS: 175.0000 ug | ORAL_TABLET | Freq: Every day | ORAL | 1 refills | Status: AC
